# Patient Record
Sex: Female | Born: 1990 | Hispanic: Yes | Marital: Single | Smoking: Never smoker
Health system: Southern US, Community
[De-identification: ages and names within clinical notes are randomized; demographics above are authoritative.]

## PROBLEM LIST (undated history)

## (undated) DIAGNOSIS — G43909 Migraine, unspecified, not intractable, without status migrainosus: Secondary | ICD-10-CM

## (undated) DIAGNOSIS — D649 Anemia, unspecified: Secondary | ICD-10-CM

## (undated) DIAGNOSIS — I729 Aneurysm of unspecified site: Secondary | ICD-10-CM

## (undated) HISTORY — DX: Anemia, unspecified: D64.9

## (undated) HISTORY — DX: Migraine, unspecified, not intractable, without status migrainosus: G43.909

## (undated) HISTORY — DX: Aneurysm of unspecified site: I72.9

---

## 2021-01-15 ENCOUNTER — Other Ambulatory Visit: Payer: Self-pay

## 2021-01-15 ENCOUNTER — Emergency Department (HOSPITAL_COMMUNITY)
Admission: EM | Admit: 2021-01-15 | Discharge: 2021-01-15 | Disposition: A | Discharge status: Home / Self Care | Payer: Medicaid Other | Attending: Emergency Medicine | Admitting: Emergency Medicine

## 2021-01-15 ENCOUNTER — Emergency Department (HOSPITAL_COMMUNITY): Payer: Medicaid Other

## 2021-01-15 ENCOUNTER — Encounter (HOSPITAL_COMMUNITY): Payer: Self-pay | Admitting: Emergency Medicine

## 2021-01-15 DIAGNOSIS — R112 Nausea with vomiting, unspecified: Secondary | ICD-10-CM | POA: Insufficient documentation

## 2021-01-15 DIAGNOSIS — K625 Hemorrhage of anus and rectum: Secondary | ICD-10-CM | POA: Diagnosis not present

## 2021-01-15 DIAGNOSIS — K640 First degree hemorrhoids: Secondary | ICD-10-CM | POA: Diagnosis not present

## 2021-01-15 DIAGNOSIS — R109 Unspecified abdominal pain: Secondary | ICD-10-CM | POA: Diagnosis not present

## 2021-01-15 LAB — COMPREHENSIVE METABOLIC PANEL
ALT: 24 U/L (ref 0–44)
AST: 25 U/L (ref 15–41)
Albumin: 4.2 g/dL (ref 3.5–5.0)
Alkaline Phosphatase: 40 U/L (ref 38–126)
Anion gap: 5 (ref 5–15)
BUN: 7 mg/dL (ref 6–20)
CO2: 24 mmol/L (ref 22–32)
Calcium: 9.4 mg/dL (ref 8.9–10.3)
Chloride: 110 mmol/L (ref 98–111)
Creatinine, Ser: 0.74 mg/dL (ref 0.44–1.00)
GFR, Estimated: >60 mL/min (ref >60)
Glucose, Bld: 99 mg/dL (ref 70–99)
Potassium: 3.8 mmol/L (ref 3.5–5.1)
Sodium: 139 mmol/L (ref 135–145)
Total Bilirubin: 0.6 mg/dL (ref 0.3–1.2)
Total Protein: 7.6 g/dL (ref 6.5–8.1)

## 2021-01-15 LAB — CBC
HCT: 34.7 % — ABNORMAL LOW (ref 36.0–46.0)
Hemoglobin: 10.9 g/dL — ABNORMAL LOW (ref 12.0–15.0)
MCH: 27 pg (ref 26.0–34.0)
MCHC: 31.4 g/dL (ref 30.0–36.0)
MCV: 85.9 fL (ref 80.0–100.0)
Platelets: 213 10*3/uL (ref 150–400)
RBC: 4.04 MIL/uL (ref 3.87–5.11)
RDW: 15.1 % (ref 11.5–15.5)
WBC: 4.2 10*3/uL (ref 4.0–10.5)
nRBC: 0 % (ref 0.0–0.2)

## 2021-01-15 LAB — TYPE AND SCREEN
ABO/RH(D): O POS
Antibody Screen: NEGATIVE

## 2021-01-15 LAB — I-STAT BETA HCG BLOOD, ED (MC, WL, AP ONLY): I-stat hCG, quantitative: <5 m[IU]/mL (ref <5)

## 2021-01-15 NOTE — ED Provider Notes (Signed)
[facility]-EMERGENCY DEPT Provider Note   CSN: [id number] Arrival date & time: 01/15/21  6301     History Chief Complaint  Patient presents with   Rectal Bleeding    Brooke Elliott is a 30 y.o. female.  The history is provided by the patient and medical records.  Rectal Bleeding Brooke Elliott is a 30 y.o. female who presents to the Emergency Department complaining of rectal bleeding.  She started having BRBPR on June 5.  Occurs when she has a BM.  Was having one brown BM daily with some associated blood.  Last night developed nausea (1230).  Had associated abdominal cramping - mostly left sided, foul smelling BM followed by vomiting.  There was a large amount of blood in the commode.  Had four BMs this morning - brown with blood mixed in.  No hematemesis.    No fever, sob.  No dysuria.    Has no known medical problems.  Was told as a child she had something different with her heart.  No prior abdominal surgeries.  Has no known medical problems.    She goes to [facility] for primary care and has been diagnosed with anemia. She is not sure what her blood counts are. She has been prescribed iron but states this never helps her blood counts.    History reviewed. No pertinent past medical history.  There are no problems to display for this patient.   History reviewed. No pertinent surgical history.   OB History   No obstetric history on file.     No family history on file.     Home Medications Prior to Admission medications   Not on File    Allergies    Patient has no allergy information on record.  Review of Systems   Review of Systems  Gastrointestinal:  Positive for hematochezia.  All other systems reviewed and are negative.  Physical Exam Updated Vital Signs BP (!) 106/58 (BP Location: Right Arm)   Pulse 90   Temp 97.9 F (36.6 C) (Oral)   Resp 17   SpO2 99%   Physical Exam Vitals and nursing note reviewed.   Constitutional:      Appearance: She is well-developed.  HENT:     Head: Normocephalic and atraumatic.  Cardiovascular:     Rate and Rhythm: Normal rate and regular rhythm.     Heart sounds: No murmur heard. Pulmonary:     Effort: Pulmonary effort is normal. No respiratory distress.     Breath sounds: Normal breath sounds.  Abdominal:     Palpations: Abdomen is soft.     Tenderness: There is no abdominal tenderness. There is no guarding or rebound.  Genitourinary:    Comments: Small, nontender non-thrombosed external hemorrhoid. No gross blood on rectal exam. Musculoskeletal:        General: No tenderness.  Skin:    General: Skin is warm and dry.  Neurological:     Mental Status: She is alert and oriented to person, place, and time.  Psychiatric:        Behavior: Behavior normal.    ED Results / Procedures / Treatments   Labs (all labs ordered are listed, but only abnormal results are displayed) Labs Reviewed  CBC - Abnormal; Notable for the following components:      Result Value   Hemoglobin 10.9 (*)    HCT 34.7 (*)    All other components within normal limits  COMPREHENSIVE METABOLIC PANEL  I-STAT BETA  HCG BLOOD, ED ([facility], [facility], [facility] ONLY)  POC OCCULT BLOOD, ED  TYPE AND SCREEN  ABO/RH    EKG None  Radiology CT Abdomen Pelvis Wo Contrast  Result Date: 01/15/2021 CLINICAL DATA:  Rectal bleeding for 4 days, dark red blood in stool with bowel movements, intermittent lower abdominal pain and rectal pain, question diverticulitis EXAM: CT ABDOMEN AND PELVIS WITHOUT CONTRAST TECHNIQUE: Multidetector CT imaging of the abdomen and pelvis was performed following the standard protocol without IV contrast. Sagittal and coronal MPR images reconstructed from axial data set. No oral contrast administered. COMPARISON:  None FINDINGS: Lower chest: Lung bases clear Hepatobiliary: Contracted gallbladder.  Liver unremarkable. Pancreas: Normal appearance Spleen: Normal appearance.  Two  splenules, larger 2.0 cm diameter. Adrenals/Urinary Tract: Adrenal glands, kidneys, and ureters normal appearance. Bladder decompressed, poorly assessed Stomach/Bowel: Appendix not visualized. No pericecal inflammatory process. Stomach and bowel loops otherwise unremarkable. Vascular/Lymphatic: Aorta normal caliber. No adenopathy. Normal size mesenteric lymph nodes in mid abdomen and RIGHT lower quadrant. Reproductive: Unremarkable uterus and adnexa Other: No free air or free fluid. No hernia or inflammatory process. Musculoskeletal: Mild degenerative disc disease changes L5-S1. IMPRESSION: No acute intra-abdominal or intrapelvic abnormalities. Electronically Signed   By: Ulyses Southward M.D.   On: 01/15/2021 18:35    Procedures Procedures   Medications Ordered in ED Medications - No data to display  ED Course  I have reviewed the triage vital signs and the nursing notes.  Pertinent labs & imaging results that were available during my care of the patient were reviewed by me and considered in my medical decision making (see chart for details).    MDM Rules/Calculators/A&P                         patient here for evaluation of hematochezia. Did have normal bowel movements with blood in the toilet for several days then nausea, vomiting, diarrhea today. She did have some abdominal tenderness today as well. On examination abdomen is benign. CBC with anemia. She states she has a history of anemia, baseline hemoglobin is unknown. She has no gross blood on rectal examination. She does have one small external hemorrhoid. CT scan is negative for acute abnormality. Feel patient is very low risk for major G.I. bleed and feels she is stable for outpatient G.I. follow-up. Discussed with patient home care for hematochezia, possibly secondary to hemorrhoid versus viral process. Discussed outpatient follow-up and return precautions.  Final Clinical Impression(s) / ED Diagnoses Final diagnoses:  Rectal bleeding   Grade I hemorrhoids    Rx / DC Orders ED Discharge Orders     None        Tilden Fossa, MD 01/15/21 2212

## 2021-01-15 NOTE — ED Provider Notes (Signed)
Emergency Medicine Provider Triage Evaluation Note  Brooke Elliott , a 30 y.o. female  was evaluated in triage.  Pt complains of rectal bleeding.  Has been ongoing for the past 4 days she has been seeing dark red blood in her stool with bowel movement.  Also reports she has had some intermittent lower abdominal pain and associated rectal pain.  Did have 1 episode of vomiting.  No fevers.  Took a dose of aspirin a few days ago but otherwise no blood thinners.  Review of Systems  Positive: Rectal bleeding, abdominal pain Negative: Syncope  Physical Exam  BP 133/72 (BP Location: Right Arm)   Pulse 63   Temp 97.6 F (36.4 C) (Oral)   Resp 18   SpO2 100%  Gen:   Awake, no distress   Resp:  Normal effort  MSK:   Moves extremities without difficulty  Other:  Abdomen soft, nontender  Medical Decision Making  Medically screening exam initiated at 10:47 AM.  Appropriate orders placed.  Brooke Elliott was informed that the remainder of the evaluation will be completed by another provider, this initial triage assessment does not replace that evaluation, and the importance of remaining in the ED until their evaluation is complete.     Dartha Lodge, PA-C 01/15/21 1111    [name], MD 01/21/21 [phone]

## 2021-01-15 NOTE — ED Triage Notes (Signed)
Per pt, states she has been having bleeding from her rectum since 6/5-states dark in color-occurs with BM-some abdominal cramping for 2 weeks

## 2021-02-12 ENCOUNTER — Emergency Department (HOSPITAL_COMMUNITY)
Admission: EM | Admit: 2021-02-12 | Discharge: 2021-02-13 | Disposition: A | Discharge status: Home / Self Care | Payer: Medicaid Other | Attending: Emergency Medicine | Admitting: Emergency Medicine

## 2021-02-12 ENCOUNTER — Emergency Department (HOSPITAL_COMMUNITY): Payer: Medicaid Other

## 2021-02-12 ENCOUNTER — Encounter (HOSPITAL_COMMUNITY): Payer: Self-pay

## 2021-02-12 ENCOUNTER — Other Ambulatory Visit: Payer: Self-pay

## 2021-02-12 DIAGNOSIS — G4459 Other complicated headache syndrome: Secondary | ICD-10-CM | POA: Diagnosis not present

## 2021-02-12 DIAGNOSIS — G43109 Migraine with aura, not intractable, without status migrainosus: Secondary | ICD-10-CM

## 2021-02-12 DIAGNOSIS — Z20822 Contact with and (suspected) exposure to covid-19: Secondary | ICD-10-CM | POA: Insufficient documentation

## 2021-02-12 DIAGNOSIS — R519 Headache, unspecified: Secondary | ICD-10-CM | POA: Diagnosis present

## 2021-02-12 LAB — CBC WITH DIFFERENTIAL/PLATELET
Abs Immature Granulocytes: 0.01 10*3/uL (ref 0.00–0.07)
Basophils Absolute: 0 10*3/uL (ref 0.0–0.1)
Basophils Relative: 0 %
Eosinophils Absolute: 0 10*3/uL (ref 0.0–0.5)
Eosinophils Relative: 0 %
HCT: 35.1 % — ABNORMAL LOW (ref 36.0–46.0)
Hemoglobin: 11.1 g/dL — ABNORMAL LOW (ref 12.0–15.0)
Immature Granulocytes: 0 %
Lymphocytes Relative: 28 %
Lymphs Abs: 1.6 10*3/uL (ref 0.7–4.0)
MCH: 27.6 pg (ref 26.0–34.0)
MCHC: 31.6 g/dL (ref 30.0–36.0)
MCV: 87.3 fL (ref 80.0–100.0)
Monocytes Absolute: 0.3 10*3/uL (ref 0.1–1.0)
Monocytes Relative: 4 %
Neutro Abs: 3.8 10*3/uL (ref 1.7–7.7)
Neutrophils Relative %: 68 %
Platelets: 207 10*3/uL (ref 150–400)
RBC: 4.02 MIL/uL (ref 3.87–5.11)
RDW: 15.6 % — ABNORMAL HIGH (ref 11.5–15.5)
WBC: 5.7 10*3/uL (ref 4.0–10.5)
nRBC: 0 % (ref 0.0–0.2)

## 2021-02-12 MED ORDER — DIPHENHYDRAMINE HCL 50 MG/ML IJ SOLN
25.0000 mg | Freq: Once | INTRAMUSCULAR | Status: DC
  Filled 2021-02-12: qty 1

## 2021-02-12 MED ORDER — METOCLOPRAMIDE HCL 5 MG/ML IJ SOLN
10.0000 mg | Freq: Once | INTRAMUSCULAR | Status: DC
  Filled 2021-02-12: qty 2

## 2021-02-12 MED ORDER — SODIUM CHLORIDE 0.9 % IV BOLUS
1000.0000 mL | Freq: Once | INTRAVENOUS | Status: AC
  Administered 2021-02-12: 1000 mL via INTRAVENOUS

## 2021-02-12 NOTE — ED Triage Notes (Signed)
Pt arrives EMS with c/o recurrent headache after lack of sleep, intermittent fasting and multiple stressors in the home. Pt became concerned after experiencing a brief period of "unable to talk and numbness of the right hand" that quickly resolved. Sx began at 1830 and resolved by 1900.

## 2021-02-12 NOTE — ED Provider Notes (Signed)
[facility]-EMERGENCY DEPT Provider Note   CSN: [id number] Arrival date & time: 02/12/21  1946     History Chief Complaint  Patient presents with   Headache    Brooke Elliott is a 30 y.o. female.  Patient here with episode of difficulty speaking and right arm tingling.  States she was having a conversation with her family when she developed tingling in her right hand first involving her fifth digit then fourth digit then third digit then second digit and entire right hand with difficulty moving the fingers and numbness.  She states at the same time she has difficulty getting her words out and was unable to talk.  Symptoms lasted about 30 minutes and have since resolved.  She feels back to baseline now without having a headache.  Contrary to triage note she was not having headache before this happened.  Headache gradually onset about 6 PM after her speech returned.  Denies thunderclap onset.  Does have a history of migraines in the past but this feels different and she is not had a migraine for several years.  No photophobia or phonophobia.  No fever or vomiting.  No recent illness with cough, cold or congestion symptoms.  She reported that she thought her right hand felt weak and was numb and tingly for about the same time as her speech.  She had no facial droop.  She had no difficulty moving her leg.  She has chronic back pain which is unchanged.  Denies any argument with her family and states this is normal conversation.  The history is provided by the patient.  Headache Associated symptoms: numbness and weakness   Associated symptoms: no abdominal pain, no congestion, no cough, no dizziness, no fatigue, no fever, no myalgias, no nausea, no seizures and no vomiting       History reviewed. No pertinent past medical history.  There are no problems to display for this patient.   History reviewed. No pertinent surgical history.   OB History   No obstetric history on  file.     No family history on file.  Social History   Tobacco Use   Smoking status: Never   Smokeless tobacco: Never  Substance Use Topics   Alcohol use: Not Currently   Drug use: Never    Home Medications Prior to Admission medications   Not on File    Allergies    Patient has no known allergies.  Review of Systems   Review of Systems  Constitutional:  Negative for activity change, appetite change, fatigue and fever.  HENT:  Negative for congestion and rhinorrhea.   Eyes:  Negative for visual disturbance.  Respiratory:  Negative for cough, chest tightness and shortness of breath.   Cardiovascular:  Negative for chest pain.  Gastrointestinal:  Negative for abdominal pain, nausea and vomiting.  Genitourinary:  Negative for dysuria and hematuria.  Musculoskeletal:  Negative for arthralgias and myalgias.  Skin:  Negative for rash.  Neurological:  Positive for weakness, numbness and headaches. Negative for dizziness and seizures.   all other systems are negative except as noted in the HPI and PMH.   Physical Exam Updated Vital Signs BP (!) 112/55 (BP Location: Left Arm)   Pulse 69   Temp 99.9 F (37.7 C) (Oral)   Resp 16   Ht 5\' 5"  (1.651 m)   Wt 66.2 kg   SpO2 100%   BMI 24.30 kg/m   Physical Exam Vitals and nursing note reviewed.  Constitutional:  General: She is not in acute distress.    Appearance: She is well-developed. She is not ill-appearing.  HENT:     Head: Normocephalic and atraumatic.     Mouth/Throat:     Pharynx: No oropharyngeal exudate.  Eyes:     Conjunctiva/sclera: Conjunctivae normal.     Pupils: Pupils are equal, round, and reactive to light.  Neck:     Comments: No meningismus. Cardiovascular:     Rate and Rhythm: Normal rate and regular rhythm.     Heart sounds: Normal heart sounds. No murmur heard. Pulmonary:     Effort: Pulmonary effort is normal. No respiratory distress.     Breath sounds: Normal breath sounds.  Chest:      Chest wall: No tenderness.  Abdominal:     Palpations: Abdomen is soft.     Tenderness: There is no abdominal tenderness. There is no guarding or rebound.  Musculoskeletal:        General: No tenderness. Normal range of motion.     Cervical back: Normal range of motion and neck supple.  Skin:    General: Skin is warm.  Neurological:     Mental Status: She is alert and oriented to person, place, and time.     Cranial Nerves: No cranial nerve deficit.     Motor: No abnormal muscle tone.     Coordination: Coordination normal.     Comments: CN 2-12 intact, no ataxia on finger to nose, no nystagmus, 5/5 strength throughout, no pronator drift, Romberg negative, normal gait.   Psychiatric:        Behavior: Behavior normal.    ED Results / Procedures / Treatments   Labs (all labs ordered are listed, but only abnormal results are displayed) Labs Reviewed  CBC WITH DIFFERENTIAL/PLATELET - Abnormal; Notable for the following components:      Result Value   Hemoglobin 11.1 (*)    HCT 35.1 (*)    RDW 15.6 (*)    All other components within normal limits  COMPREHENSIVE METABOLIC PANEL - Abnormal; Notable for the following components:   Glucose, Bld 100 (*)    All other components within normal limits  RESP PANEL BY RT-PCR (FLU A&B, COVID) ARPGX2  I-STAT BETA HCG BLOOD, ED ([facility], [facility], [facility] ONLY)    EKG None  Radiology No results found.  Procedures Procedures   Medications Ordered in ED Medications  metoCLOPramide (REGLAN) injection 10 mg (has no administration in time range)  diphenhydrAMINE (BENADRYL) injection 25 mg (has no administration in time range)  sodium chloride 0.9 % bolus 1,000 mL (has no administration in time range)    ED Course  I have reviewed the triage vital signs and the nursing notes.  Pertinent labs & imaging results that were available during my care of the patient were reviewed by me and considered in my medical decision making (see chart for  details).    MDM Rules/Calculators/A&P                         Episode of right arm tingling and weakness with difficulty speaking, now resolved after about 30 minutes.  Associated with gradual onset headache that occurred after her neurological issues had resolved.  Does have a history of migraines.  Neurological exam is normal.  Suspect complicated migraine.  Will obtain CT head as patient has no recent neuroimaging.  CT head is normal.  Discussed with neurology Dr. Derry Lory.  He agrees likely complicated migraine  but recommends MRI and MRV of brain. Not available at this facility.  Patient refuses IV medications and wishes to have p.o. Tylenol.  Patient prefers to wait for MRI availability at [facility] rather than be transferred to [facility]. [facility] under lockdown with multiple critical trauma patients.  MRI and MRV are negative.  Patient continues to feel well. No further headache.   Refer to neurology for followup. Return precautions discussed.  Final diagnoses:  Complicated migraine    Rx / DC Orders ED Discharge Orders     None        Deyja Sochacki, Jeannett Senior, MD 02/13/21 [phone]

## 2021-02-13 ENCOUNTER — Emergency Department (HOSPITAL_COMMUNITY): Payer: Medicaid Other

## 2021-02-13 LAB — COMPREHENSIVE METABOLIC PANEL
ALT: 15 U/L (ref 0–44)
AST: 17 U/L (ref 15–41)
Albumin: 4.4 g/dL (ref 3.5–5.0)
Alkaline Phosphatase: 41 U/L (ref 38–126)
Anion gap: 9 (ref 5–15)
BUN: 13 mg/dL (ref 6–20)
CO2: 22 mmol/L (ref 22–32)
Calcium: 9.3 mg/dL (ref 8.9–10.3)
Chloride: 109 mmol/L (ref 98–111)
Creatinine, Ser: 0.67 mg/dL (ref 0.44–1.00)
GFR, Estimated: >60 mL/min (ref >60)
Glucose, Bld: 100 mg/dL — ABNORMAL HIGH (ref 70–99)
Potassium: 3.8 mmol/L (ref 3.5–5.1)
Sodium: 140 mmol/L (ref 135–145)
Total Bilirubin: 0.3 mg/dL (ref 0.3–1.2)
Total Protein: 8 g/dL (ref 6.5–8.1)

## 2021-02-13 LAB — RESP PANEL BY RT-PCR (FLU A&B, COVID) ARPGX2
Influenza A by PCR: NEGATIVE
Influenza B by PCR: NEGATIVE
SARS Coronavirus 2 by RT PCR: NEGATIVE

## 2021-02-13 LAB — I-STAT BETA HCG BLOOD, ED (MC, WL, AP ONLY): I-stat hCG, quantitative: <5 m[IU]/mL (ref <5)

## 2021-02-13 MED ORDER — GADOBUTROL 1 MMOL/ML IV SOLN
6.0000 mL | Freq: Once | INTRAVENOUS | Status: AC | PRN
  Administered 2021-02-13: 6 mL via INTRAVENOUS

## 2021-02-13 MED ORDER — ACETAMINOPHEN 500 MG PO TABS
1000.0000 mg | ORAL_TABLET | Freq: Once | ORAL | Status: AC
  Administered 2021-02-13: 1000 mg via ORAL
  Filled 2021-02-13: qty 2

## 2021-02-13 NOTE — ED Notes (Signed)
Vital signs stable. 

## 2021-02-13 NOTE — ED Notes (Signed)
Pt in MRI, unable to obtain vitals.

## 2021-02-13 NOTE — ED Notes (Signed)
Pt ambulated to bathroom with no assistance.  

## 2021-02-13 NOTE — ED Notes (Signed)
Patient is resting comfortably. 

## 2021-02-13 NOTE — Discharge Instructions (Addendum)
Your testing is reassuring.  No evidence of stroke.  Follow-up with your primary doctor as well as neurologist.  Return to the ED if you develop new or worsening symptoms

## 2021-02-13 NOTE — ED Notes (Addendum)
Patient was given a ham sandwich and cheese with crackers. Patient has water at bedside.

## 2021-02-13 NOTE — ED Notes (Addendum)
Patient transported to MRI 

## 2021-12-01 ENCOUNTER — Emergency Department (HOSPITAL_COMMUNITY)
Admission: EM | Admit: 2021-12-01 | Discharge: 2021-12-01 | Disposition: A | Discharge status: Home / Self Care | Payer: Medicaid Other | Attending: Emergency Medicine | Admitting: Emergency Medicine

## 2021-12-01 DIAGNOSIS — R531 Weakness: Secondary | ICD-10-CM | POA: Diagnosis not present

## 2021-12-01 DIAGNOSIS — R42 Dizziness and giddiness: Secondary | ICD-10-CM | POA: Diagnosis present

## 2021-12-01 DIAGNOSIS — N9489 Other specified conditions associated with female genital organs and menstrual cycle: Secondary | ICD-10-CM | POA: Insufficient documentation

## 2021-12-01 LAB — COMPREHENSIVE METABOLIC PANEL
ALT: 39 U/L (ref 0–44)
AST: 39 U/L (ref 15–41)
Albumin: 4.1 g/dL (ref 3.5–5.0)
Alkaline Phosphatase: 51 U/L (ref 38–126)
Anion gap: 8 (ref 5–15)
BUN: 6 mg/dL (ref 6–20)
CO2: 22 mmol/L (ref 22–32)
Calcium: 9.6 mg/dL (ref 8.9–10.3)
Chloride: 109 mmol/L (ref 98–111)
Creatinine, Ser: 0.71 mg/dL (ref 0.44–1.00)
GFR, Estimated: >60 mL/min (ref >60)
Glucose, Bld: 96 mg/dL (ref 70–99)
Potassium: 3.9 mmol/L (ref 3.5–5.1)
Sodium: 139 mmol/L (ref 135–145)
Total Bilirubin: 0.4 mg/dL (ref 0.3–1.2)
Total Protein: 7.3 g/dL (ref 6.5–8.1)

## 2021-12-01 LAB — CBC WITH DIFFERENTIAL/PLATELET
Abs Immature Granulocytes: 0.01 10*3/uL (ref 0.00–0.07)
Basophils Absolute: 0 10*3/uL (ref 0.0–0.1)
Basophils Relative: 0 %
Eosinophils Absolute: 0 10*3/uL (ref 0.0–0.5)
Eosinophils Relative: 0 %
HCT: 34.8 % — ABNORMAL LOW (ref 36.0–46.0)
Hemoglobin: 10.6 g/dL — ABNORMAL LOW (ref 12.0–15.0)
Immature Granulocytes: 0 %
Lymphocytes Relative: 21 %
Lymphs Abs: 1 10*3/uL (ref 0.7–4.0)
MCH: 24.5 pg — ABNORMAL LOW (ref 26.0–34.0)
MCHC: 30.5 g/dL (ref 30.0–36.0)
MCV: 80.4 fL (ref 80.0–100.0)
Monocytes Absolute: 0.3 10*3/uL (ref 0.1–1.0)
Monocytes Relative: 6 %
Neutro Abs: 3.7 10*3/uL (ref 1.7–7.7)
Neutrophils Relative %: 73 %
Platelets: 244 10*3/uL (ref 150–400)
RBC: 4.33 MIL/uL (ref 3.87–5.11)
RDW: 15.9 % — ABNORMAL HIGH (ref 11.5–15.5)
WBC: 5.1 10*3/uL (ref 4.0–10.5)
nRBC: 0 % (ref 0.0–0.2)

## 2021-12-01 LAB — I-STAT BETA HCG BLOOD, ED (MC, WL, AP ONLY): I-stat hCG, quantitative: <5 m[IU]/mL (ref <5)

## 2021-12-01 NOTE — Discharge Instructions (Addendum)
Don't get concerned about the title being for "substance abuse." These places accept counseling for all types of concerns. Continue to drink plenty of fluids - more water than coffee. Follow up with primary care doctor for lab results as well as heart monitor. Complete Echocardiogram scheduled for this Friday. Look for worrisome signs and symptoms we discussed, and do not hesitate to return to emergency department if noticed.  ?

## 2021-12-01 NOTE — ED Triage Notes (Signed)
EMS stated, she has been weak and dizzy. She called [place] in the grocery store because she felt weak. Went to her Dr. Maurice Small week and treated her for anxiety. ?

## 2021-12-01 NOTE — ED Provider Triage Note (Signed)
Emergency Medicine Provider Triage Evaluation Note ? ?Brooke Elliott , a 31 y.o. female  was evaluated in triage.  Pt complains of weakness, dizziness (about to pass out), brought in by EMS, feet started to feel tingling on the bottoms. Symptoms x 2 weeks, BP was low 98/68 recently, unsure if related.  ?Went to PCP last week and had unknown labs sent out, was told it would take 2 weeks to get results. Known hgb baseline 10, started on B12 and Folic acid to try and help, took for 3 days with ? Improvement.  ?Ambulatory from grocery store to the truck.  ?Review of Systems  ?Positive: Weakness, pre syncopal  ?Negative: CP, SHOB, heavy periods, blood in stools ? ?Physical Exam  ?There were no vitals taken for this visit. ?Gen:   Awake, no distress   ?Resp:  Normal effort  ?MSK:   Moves extremities without difficulty  ?Other:  Speech clear, no distress  ?Medical Decision Making  ?Medically screening exam initiated at 11:13 AM.  Appropriate orders placed.  Dustee Bottenfield was informed that the remainder of the evaluation will be completed by another provider, this initial triage assessment does not replace that evaluation, and the importance of remaining in the ED until their evaluation is complete. ? ? ?  ?Jeannie Fend, PA-C ?12/01/21 1121 ? ?

## 2021-12-01 NOTE — ED Provider Notes (Addendum)
?[facility] ?Provider Note ? ? ?CSN: [id number] ?Arrival date & time: 12/01/21  1101 ? ?  ? ?History ? ?Chief Complaint  ?Patient presents with  ? Dizziness  ? Weakness  ? ? ?Brooke Elliott is a [age] y.o. female. ? ? ?Dizziness ?Associated symptoms: no blood in stool, no chest pain, no diarrhea, no headaches, no nausea, no palpitations, no vomiting and no weakness   ?Weakness ?Associated symptoms: dizziness   ?Associated symptoms: no abdominal pain, no arthralgias, no chest pain, no diarrhea, no dysuria, no fever, no frequency, no headaches, no nausea, no seizures, no urgency and no vomiting   ? ?31 year old female presents emergency department with episode of dizziness/lightheadedness after eating a sandwich earlier this morning.  Episodes are often preceded by postural change such as standing up from a bent over position. She has not lost consciousness. She states she has had multiple similar episodes over the past couple weeks and they last a few minutes. No associated weakness or persistent symptoms past the episodes. She went to her family doctor last week where they did a thorough lab work up, but she has not heard the results back. She has a past medical history significant for anxiety and patient reported Mitral valve regurgitation diagnosed when she was younger. Patient states she has had a workup for these same symptoms 2-3 years ago that was inconclusive. She has been taking B12 and folate vitamins the past few days without any symptoms, but didn't take them this morning and had symptoms. Upon further questioning, she expressing being under extreme amounts of stress between caring for her young son, taking multiple classes, working full time, processing through a divorce... She is accompanied by her mother who is concerned as well. Patient denies cp, sob, abdominal pain, n/v/d,  urinary/bowel symptoms, weakness, fever, chills, cough.  ? ?Home Medications ?Prior to  Admission medications   ?Not on File  ?   ? ?Allergies    ?Patient has no known allergies.   ? ?Review of Systems   ?Review of Systems  ?Constitutional:  Negative for chills, fatigue and fever.  ?HENT:  Negative for congestion, postnasal drip, rhinorrhea, sneezing and sore throat.   ?Cardiovascular:  Negative for chest pain, palpitations and leg swelling.  ?Gastrointestinal:  Negative for abdominal distention, abdominal pain, blood in stool, diarrhea, nausea and vomiting.  ?Genitourinary:  Negative for decreased urine volume, dysuria, flank pain, frequency, hematuria, pelvic pain, urgency, vaginal bleeding and vaginal discharge.  ?Musculoskeletal:  Negative for arthralgias, back pain, joint swelling and neck stiffness.  ?Skin:  Negative for color change, pallor, rash and wound.  ?Neurological:  Positive for dizziness and light-headedness. Negative for tremors, seizures, syncope, speech difficulty, weakness, numbness and headaches.  ? ?Physical Exam ?Updated Vital Signs ?BP 114/79   Pulse 68   Temp 98.1 ?F (36.7 ?C) (Oral)   Resp 15   SpO2 100%  ?Physical Exam ?Vitals and nursing note reviewed.  ?Constitutional:   ?   General: She is not in acute distress. ?   Appearance: Normal appearance. She is normal weight. She is not ill-appearing, toxic-appearing or diaphoretic.  ?HENT:  ?   Head: Normocephalic.  ?   Nose: Nose normal.  ?   Mouth/Throat:  ?   Mouth: Mucous membranes are moist.  ?   Pharynx: Oropharynx is clear.  ?Eyes:  ?   Pupils: Pupils are equal, round, and reactive to light.  ?Cardiovascular:  ?   Rate and Rhythm: Normal rate and  regular rhythm.  ?   Pulses: Normal pulses.  ?   Heart sounds: Normal heart sounds. No murmur heard. ?  No friction rub. No gallop.  ?   Comments: No murmur auscultated on exam ?Pulmonary:  ?   Effort: Pulmonary effort is normal. No respiratory distress.  ?   Breath sounds: Normal breath sounds. No stridor. No wheezing, rhonchi or rales.  ?Chest:  ?   Chest wall: No  tenderness.  ?Abdominal:  ?   General: Abdomen is flat. There is no distension.  ?   Palpations: Abdomen is soft. There is no mass.  ?   Tenderness: There is no abdominal tenderness.  ?Musculoskeletal:     ?   General: Normal range of motion.  ?   Cervical back: Normal range of motion and neck supple. No tenderness.  ?   Comments: Muscle strength 5/5 in upper and lower extremities, radial and posterior tibial pulses equal and full bilaterally. Patient is not experiencing any sensory deficits along major nerve distributions in upper and lower extremities. No overlying skin abnormalities noted on upper in lower extremities including erythema, edema, induration, lesions, fluctuance.   ?Skin: ?   General: Skin is warm and dry.  ?   Capillary Refill: Capillary refill takes less than 2 seconds.  ?Neurological:  ?   General: No focal deficit present.  ?   Mental Status: She is alert and oriented to person, place, and time.  ?   Cranial Nerves: No cranial nerve deficit.  ?   Sensory: No sensory deficit.  ?   Motor: No weakness.  ?   Coordination: Coordination normal.  ?   Gait: Gait normal.  ?   Deep Tendon Reflexes: Reflexes normal.  ?Psychiatric:     ?   Mood and Affect: Mood normal.     ?   Behavior: Behavior normal. Behavior is cooperative.  ? ? ?ED Results / Procedures / Treatments   ?Labs ?(all labs ordered are listed, but only abnormal results are displayed) ?Labs Reviewed  ?CBC WITH DIFFERENTIAL/PLATELET - Abnormal; Notable for the following components:  ?    Result Value  ? Hemoglobin 10.6 (*)   ? HCT 34.8 (*)   ? MCH 24.5 (*)   ? RDW 15.9 (*)   ? All other components within normal limits  ?COMPREHENSIVE METABOLIC PANEL  ?I-STAT BETA HCG BLOOD, ED ([facility], [facility], [facility] ONLY)  ? ? ?EKG ?None ? ?Radiology ?No results found. ? ?Procedures ?Procedures  ? ? ?Medications Ordered in ED ?Medications - No data to display ? ?ED Course/ Medical Decision Making/ A&P ?  ?                        ?Medical Decision Making ? ?This patient  presents to the ED for concern of dizziness/lightheadedness, this involves an extensive number of treatment options, and is a complaint that carries with it a high risk of complications and morbidity.  The differential diagnosis includes syncope, near-syncope, vertigo, anemia, arrhythmia, dehydration, orthostatic hypotension, valvular disorder, carotid artery occlusion.  ? ? ?Co morbidities that complicate the patient evaluation ? ?Anxiety ? ? ?Additional history obtained: ? ?Additional history obtained from mother and office notes ?External records from outside source obtained and reviewed including MR of head from 02/13/21 ? ? ?Lab Tests: ? ?I Ordered, and personally interpreted labs.  The pertinent results include:  no acute abnormalities ? ? ?Imaging Studies ordered: ? ?No labs ordered.  ? ? ?  Cardiac Monitoring: / EKG: ? ?The patient was maintained on a cardiac monitor.  I personally viewed and interpreted the cardiac monitored which showed an underlying rhythm of: nsr ? ? ?Consultations Obtained: ? ?No consultations obtained ? ? ?Problem List / ED Course / Critical interventions / Medication management ? ?Dizziness ?No medications were ordered.  ?Reevaluation of the patient after these medicines showed that the patient stayed the same ?I have reviewed the patients home medicines and have made adjustments as needed ? ? ?Social Determinants of Health: ? ?Multiple life stressors.  ? ? ?Test / Admission - Considered: ? ?Dizziness ?Vital signs within normal range and stable throughout visit.  ?Labs insignificant for any acute pathology.  ?Further imaging and labs considered but deemed unnecessary due to the low emergent nature of the patient presentation coupled with appropriate labs and imaging ordered by family doctor.  ?I recommended Echocardiogram in the ED today, but the patient declined because she needed to pick up her child and because she has one scheduled for later this week. ?I talked to the patient and her  mother at length about counseling options for patient's stated anxiety and multiple life stressors. We formulated a plan going forward and supplied the family with multiple counseling resources. ?We walked through options

## 2021-12-05 ENCOUNTER — Other Ambulatory Visit: Payer: Self-pay

## 2021-12-05 ENCOUNTER — Emergency Department (HOSPITAL_COMMUNITY)
Admission: EM | Admit: 2021-12-05 | Discharge: 2021-12-06 | Disposition: A | Discharge status: Home / Self Care | Payer: Medicaid Other | Attending: Emergency Medicine | Admitting: Emergency Medicine

## 2021-12-05 ENCOUNTER — Encounter (HOSPITAL_COMMUNITY): Payer: Self-pay | Admitting: Emergency Medicine

## 2021-12-05 DIAGNOSIS — R42 Dizziness and giddiness: Secondary | ICD-10-CM | POA: Diagnosis not present

## 2021-12-05 DIAGNOSIS — N9489 Other specified conditions associated with female genital organs and menstrual cycle: Secondary | ICD-10-CM | POA: Insufficient documentation

## 2021-12-05 LAB — URINALYSIS, ROUTINE W REFLEX MICROSCOPIC
Bilirubin Urine: NEGATIVE
Glucose, UA: NEGATIVE mg/dL
Hgb urine dipstick: NEGATIVE
Ketones, ur: 5 mg/dL — AB
Leukocytes,Ua: NEGATIVE
Nitrite: NEGATIVE
Protein, ur: NEGATIVE mg/dL
Specific Gravity, Urine: 1.015 (ref 1.005–1.030)
pH: 5 (ref 5.0–8.0)

## 2021-12-05 LAB — BASIC METABOLIC PANEL
Anion gap: 8 (ref 5–15)
BUN: 7 mg/dL (ref 6–20)
CO2: 20 mmol/L — ABNORMAL LOW (ref 22–32)
Calcium: 9.2 mg/dL (ref 8.9–10.3)
Chloride: 108 mmol/L (ref 98–111)
Creatinine, Ser: 0.63 mg/dL (ref 0.44–1.00)
GFR, Estimated: >60 mL/min (ref >60)
Glucose, Bld: 95 mg/dL (ref 70–99)
Potassium: 3.8 mmol/L (ref 3.5–5.1)
Sodium: 136 mmol/L (ref 135–145)

## 2021-12-05 LAB — CBC
HCT: 35 % — ABNORMAL LOW (ref 36.0–46.0)
Hemoglobin: 10.5 g/dL — ABNORMAL LOW (ref 12.0–15.0)
MCH: 24.5 pg — ABNORMAL LOW (ref 26.0–34.0)
MCHC: 30 g/dL (ref 30.0–36.0)
MCV: 81.6 fL (ref 80.0–100.0)
Platelets: 234 10*3/uL (ref 150–400)
RBC: 4.29 MIL/uL (ref 3.87–5.11)
RDW: 16.7 % — ABNORMAL HIGH (ref 11.5–15.5)
WBC: 5.8 10*3/uL (ref 4.0–10.5)
nRBC: 0 % (ref 0.0–0.2)

## 2021-12-05 LAB — CBG MONITORING, ED: Glucose-Capillary: 75 mg/dL (ref 70–99)

## 2021-12-05 LAB — I-STAT BETA HCG BLOOD, ED (MC, WL, AP ONLY): I-stat hCG, quantitative: <5 m[IU]/mL (ref <5)

## 2021-12-05 NOTE — ED Triage Notes (Signed)
Pt reported to ED with c/o of feeling dizzy and "like she is going to pass out". Pt states he has low iron and was told by PCP to come to ED if she continues to feel faint. Pt has excessive and rapid speech at time of triage, constantly talks with family member during time of triage, very seldomly taking time to pause and listen to questions asked by this RN.  ?

## 2021-12-06 ENCOUNTER — Ambulatory Visit (HOSPITAL_COMMUNITY): Admission: RE | Admit: 2021-12-06 | Payer: Medicaid Other | Source: Ambulatory Visit

## 2021-12-06 NOTE — Discharge Instructions (Signed)
You dizzy spells could be coming from your anemia (iron deficiency).  Continue taking the iron supplements.  I think that blood clot is unlikely, but I've ordered the ultrasound study.  Follow the directions below. ? ?Please follow-up with your doctor.  ?

## 2021-12-06 NOTE — ED Provider Notes (Signed)
?[facility]PT ?[facility] Emergency Department ?Provider Note ?MRN:  [id number]  ?Arrival date & time: 12/06/21    ? ?Chief Complaint   ?Dizziness ?  ?History of Present Illness   ?Brooke Elliott is a 31 y.o. year-old female presents to the ED with chief complaint of dizziness.  Reports having several episodes of dizziness over the past couple of weeks.  Seen by PCP and diagnosed with iron deficient anemia.  Patient also reports feeling anxious and that she sometimes has tingling in her legs, arms, and face when this happens.  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 ? ?History provided by patient. ? ? ?Review of Systems  ?Pertinent review of systems noted in HPI.  ? ? ?Physical Exam  ? ?Vitals:  ? 12/06/21 0252 12/06/21 0253  ?BP:  112/69  ?Pulse: 67 67  ?Resp: 14 18  ?Temp:    ?SpO2: 98% 99%  ?  ?CONSTITUTIONAL:  anxious-appearing, NAD ?NEURO:  Alert and oriented x 3, CN 3-12 grossly intact ?EYES:  eyes equal and reactive ?ENT/NECK:  Supple, no stridor  ?CARDIO:  normal rate, regular rhythm, appears well-perfused  ?PULM:  No respiratory distress,  ?GI/GU:  non-distended,  ?MSK/SPINE:  No gross deformities, no edema, moves all extremities  ?SKIN:  no rash, atraumatic ? ? ?*Additional and/or pertinent findings included in MDM below ? ?Diagnostic and Interventional Summary  ? ? EKG Interpretation ? ?Date/Time:    ?Ventricular Rate:    ?PR Interval:    ?QRS Duration:   ?QT Interval:    ?QTC Calculation:   ?R Axis:     ?Text Interpretation:   ?  ? ?  ? ?Labs Reviewed  ?BASIC METABOLIC PANEL - Abnormal; Notable for the following components:  ?    Result Value  ? CO2 20 (*)   ? All other components within normal limits  ?CBC - Abnormal; Notable for the following components:  ? Hemoglobin 10.5 (*)   ? HCT 35.0 (*)   ? MCH 24.5 (*)   ? RDW 16.7 (*)   ? All other components within normal limits  ?URINALYSIS, ROUTINE W REFLEX MICROSCOPIC - Abnormal; Notable for the following components:  ?  Color, Urine STRAW (*)   ? Ketones, ur 5 (*)   ? All other components within normal limits  ?CBG MONITORING, ED  ?I-STAT BETA HCG BLOOD, ED ([facility], [facility], [facility] ONLY)  ?  ?LE VENOUS    (Results Pending)  ?  ?Medications - No data to display  ? ?Procedures  /  Critical Care ?Procedures ? ?ED Course and Medical Decision Making  ?I have reviewed the triage vital signs, the nursing notes, and pertinent available records from the EMR. ? ?Complexity of Problems Addressed: ?Moderate Complexity: Acute illness with systemic symptoms, requiring diagnostic workup to rule out more severe disease. ?Comorbidities affecting this illness/injury include: ?Iron deficient anemia ?Social Determinants Affecting Care: ?No clinically significant social determinants affecting this chief complaint.. ? ? ?ED Course: ?After considering the following differential, anemia, anxiety, PE, dysrhythmia,  I agree with labs ordered in triage. ?I personally interpreted the labs which are notable for stable HGB. ? ? ? ?  ? ?Consultants: ?No consultations were needed in caring for this patient. ? ?Treatment and Plan: ?Outpatient follow-up.  Continue iron supplements.  While DVT/PE is thought unlikely, patient requests [place] to r/o DVT.  I ordered this for her in the morning since they are not available at this time. ? ?We also  discussed that her symptoms could be stress induced and panic attack in nature.  I've encouraged her to follow-up with her doctor and talk to her about her stress levels. ? ?Emergency department workup does not suggest an emergent condition requiring admission or immediate intervention beyond  what has been performed at this time. The patient is safe for discharge and has  been instructed to return immediately for worsening symptoms, change in  symptoms or any other concerns ? ? ? ?Final Clinical Impressions(s) / ED Diagnoses  ? ?  ICD-10-CM   ?1. Dizziness  R42   ?  ?  ?ED Discharge Orders   ? ?      Ordered  ?  LE VENOUS       ? 12/06/21  0319  ? ?  ?  ? ?  ?  ? ? ?Discharge Instructions Discussed with and Provided to Patient:  ? ? ?Discharge Instructions   ? ?  ?You dizzy spells could be coming from your anemia (iron deficiency).  Continue taking the iron supplements.  I think that blood clot is unlikely, but I've ordered the ultrasound study.  Follow the directions below. ? ?Please follow-up with your doctor.  ? ? ? ?  ?Roxy Horseman, PA-C ?12/06/21 4098 ? ?  ?Pollyann Savoy, MD ?12/06/21 [phone] ? ?

## 2023-05-01 IMAGING — CT CT HEAD W/O CM
3 series · 14 of 47 positions shown, 16 images · non-contrast
Comparison: None.

CLINICAL DATA: Headache

EXAM:
CT HEAD WITHOUT CONTRAST
TECHNIQUE: Contiguous axial images were obtained from the base of the skull
through the vertex without intravenous contrast.

[Series 2: head wo · axial · 0.45mm/px · z∈[+1586,+1716]mm · 8 of 32 slices shown, 10 images]
[im 3/32  brain]
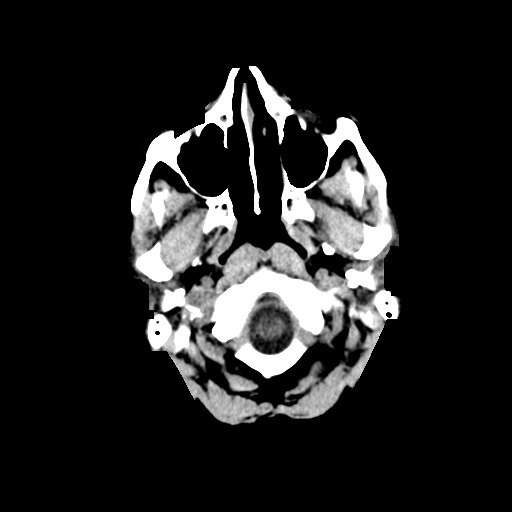
[im 3/32  bone]
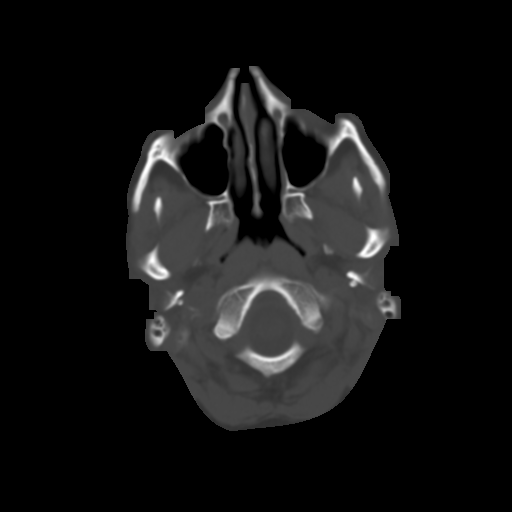
[im 7/32  brain]
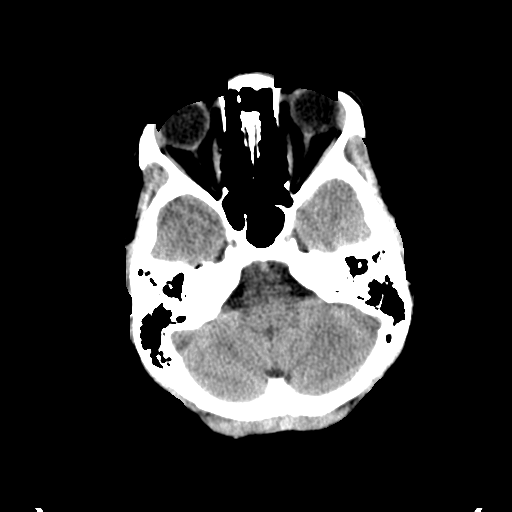
[im 10/32  brain]
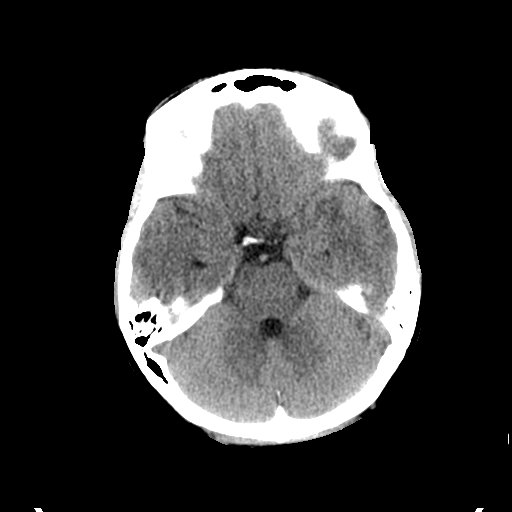
[im 14/32  brain]
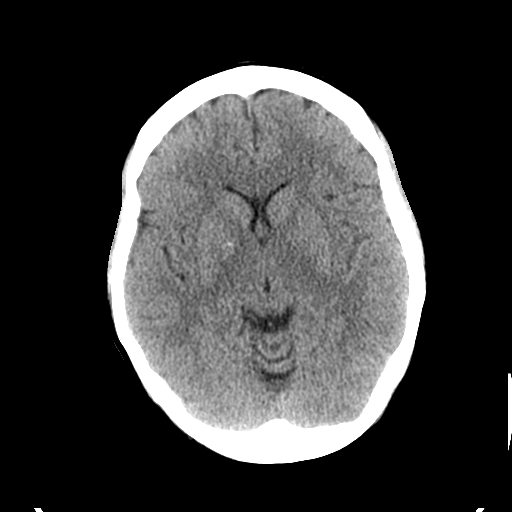
[im 18/32  brain]
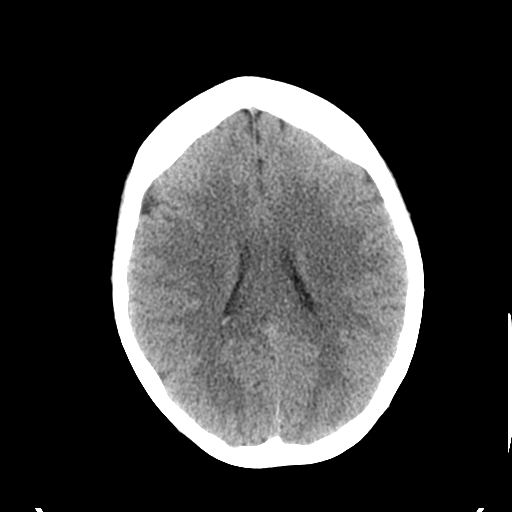
[im 18/32  bone]
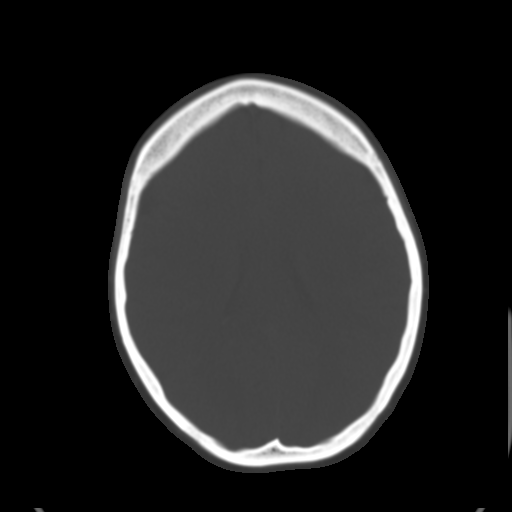
[im 22/32  brain]
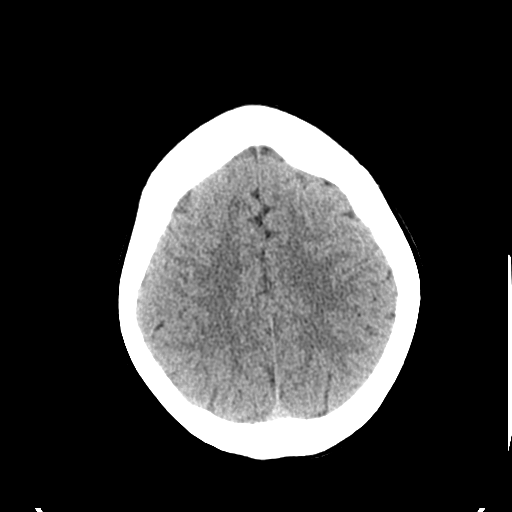
[im 25/32  brain]
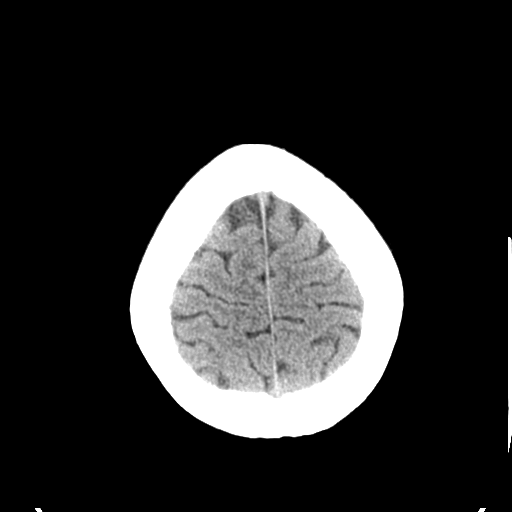
[im 29/32  brain]
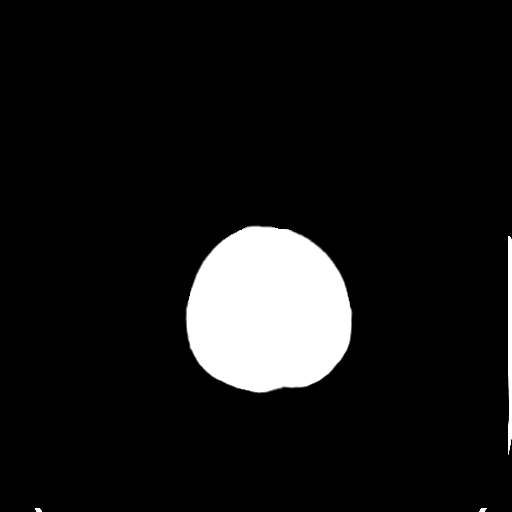

[Series 4: coronal soft tissue · coronal · 0.30mm/px · 3 of 78 slices shown]
[im 26/78  brain]
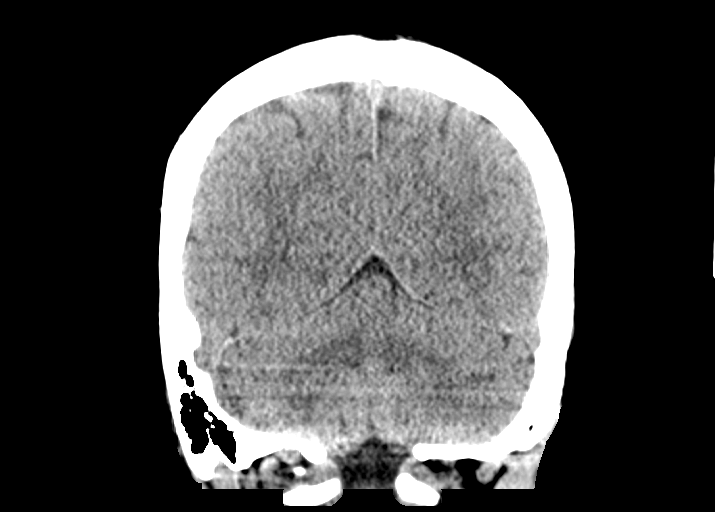
[im 35/78  brain]
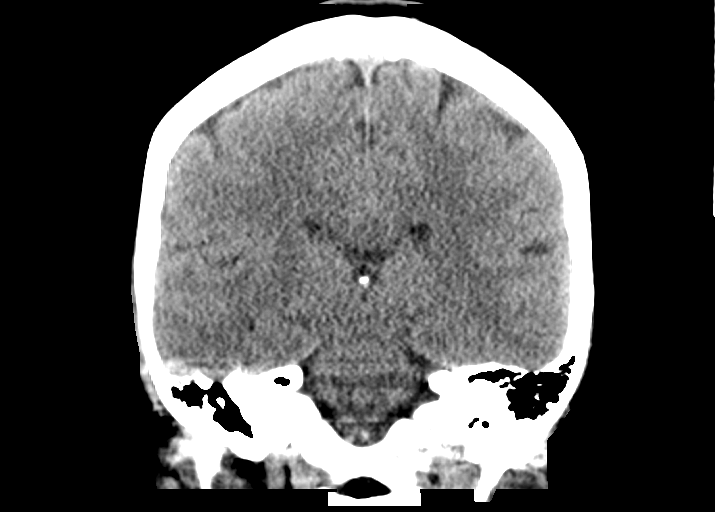
[im 43/78  brain]
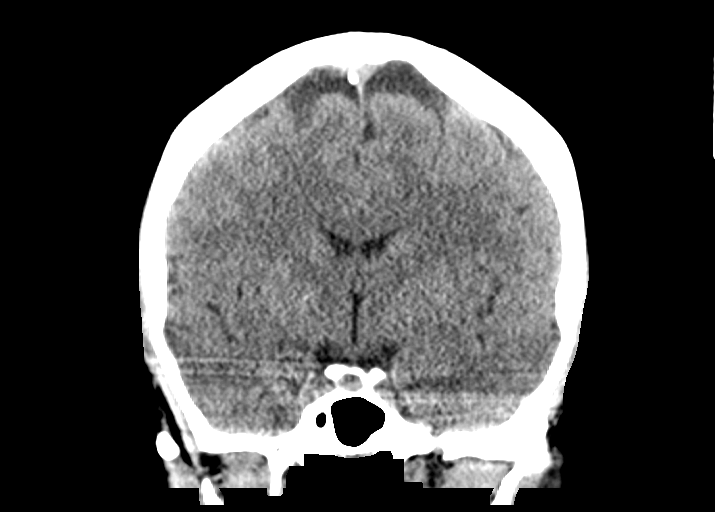

[Series 5: sagittal soft tissue · sagittal · 0.30mm/px · 3 of 71 slices shown]
[im 24/71  brain]
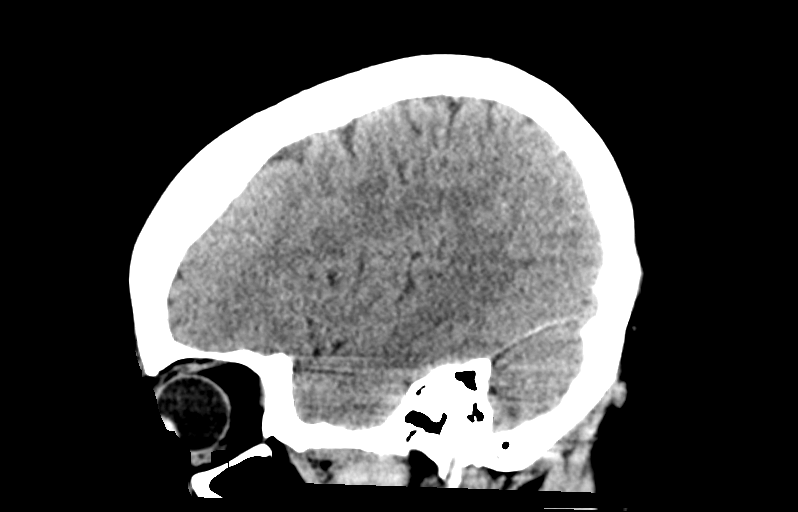
[im 36/71  brain]
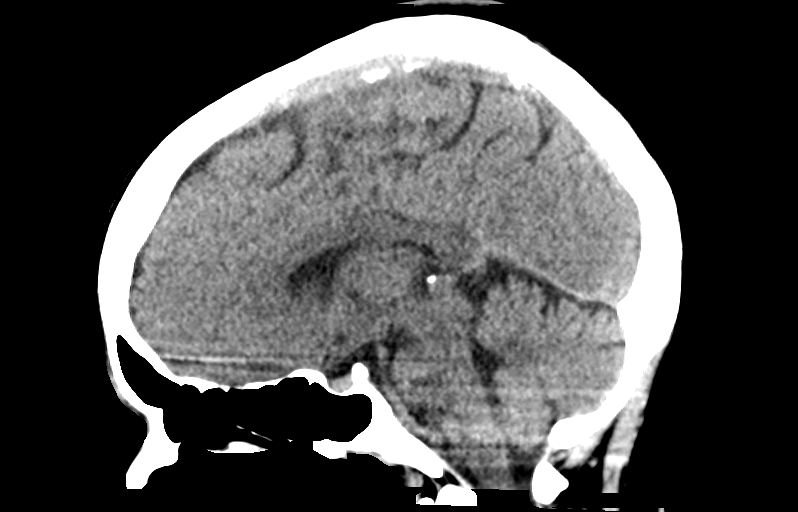
[im 47/71  brain]
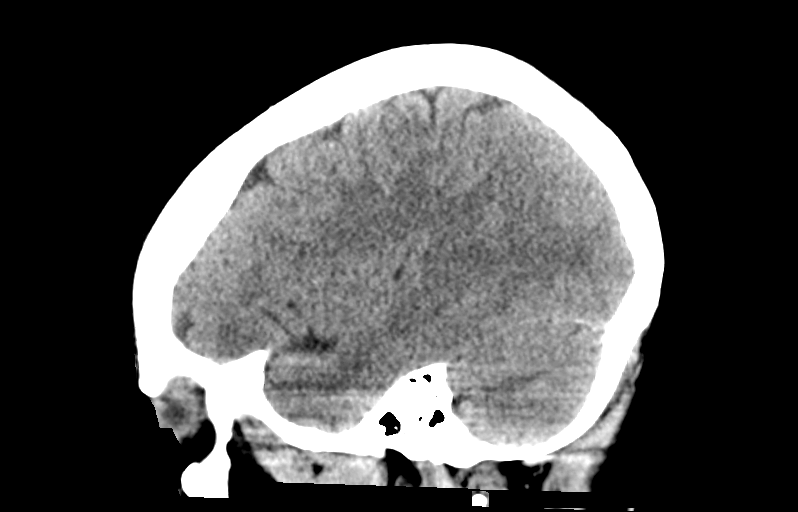

[14 of 47 positions shown; findings below may reference images not displayed]

FINDINGS: Brain: No evidence of acute infarction, hemorrhage, hydrocephalus,
extra-axial collection or mass lesion/mass effect.

Vascular: No hyperdense vessel or unexpected calcification.

Skull: Normal. Negative for fracture or focal lesion.

Sinuses/Orbits: No acute finding.

Other: None
IMPRESSION: Negative non contrasted CT appearance of the brain

## 2024-02-20 ENCOUNTER — Ambulatory Visit: Admitting: Internal Medicine

## 2024-06-05 ENCOUNTER — Emergency Department (HOSPITAL_COMMUNITY)

## 2024-06-05 ENCOUNTER — Emergency Department (HOSPITAL_COMMUNITY)
Admission: EM | Admit: 2024-06-05 | Discharge: 2024-06-05 | Disposition: A | Discharge status: Home / Self Care | Attending: Emergency Medicine | Admitting: Emergency Medicine

## 2024-06-05 ENCOUNTER — Other Ambulatory Visit: Payer: Self-pay

## 2024-06-05 DIAGNOSIS — E162 Hypoglycemia, unspecified: Secondary | ICD-10-CM | POA: Diagnosis not present

## 2024-06-05 DIAGNOSIS — G43809 Other migraine, not intractable, without status migrainosus: Secondary | ICD-10-CM | POA: Diagnosis not present

## 2024-06-05 DIAGNOSIS — R519 Headache, unspecified: Secondary | ICD-10-CM | POA: Diagnosis present

## 2024-06-05 DIAGNOSIS — G43109 Migraine with aura, not intractable, without status migrainosus: Secondary | ICD-10-CM

## 2024-06-05 DIAGNOSIS — I671 Cerebral aneurysm, nonruptured: Secondary | ICD-10-CM | POA: Diagnosis not present

## 2024-06-05 LAB — APTT: aPTT: 30 s (ref 24–36)

## 2024-06-05 LAB — COMPREHENSIVE METABOLIC PANEL WITH GFR
ALT: 13 U/L (ref 0–44)
AST: 18 U/L (ref 15–41)
Albumin: 3.8 g/dL (ref 3.5–5.0)
Alkaline Phosphatase: 49 U/L (ref 38–126)
Anion gap: 9 (ref 5–15)
BUN: 6 mg/dL (ref 6–20)
CO2: 24 mmol/L (ref 22–32)
Calcium: 9.2 mg/dL (ref 8.9–10.3)
Chloride: 105 mmol/L (ref 98–111)
Creatinine, Ser: 0.68 mg/dL (ref 0.44–1.00)
GFR, Estimated: >60 mL/min (ref >60)
Glucose, Bld: 81 mg/dL (ref 70–99)
Potassium: 3.5 mmol/L (ref 3.5–5.1)
Sodium: 138 mmol/L (ref 135–145)
Total Bilirubin: 0.4 mg/dL (ref 0.0–1.2)
Total Protein: 7.7 g/dL (ref 6.5–8.1)

## 2024-06-05 LAB — PROTIME-INR
INR: 1 (ref 0.8–1.2)
Prothrombin Time: 13.9 s (ref 11.4–15.2)

## 2024-06-05 LAB — DIFFERENTIAL
Abs Immature Granulocytes: 0.01 K/uL (ref 0.00–0.07)
Basophils Absolute: 0 K/uL (ref 0.0–0.1)
Basophils Relative: 0 %
Eosinophils Absolute: 0 K/uL (ref 0.0–0.5)
Eosinophils Relative: 1 %
Immature Granulocytes: 0 %
Lymphocytes Relative: 33 %
Lymphs Abs: 1.8 K/uL (ref 0.7–4.0)
Monocytes Absolute: 0.3 K/uL (ref 0.1–1.0)
Monocytes Relative: 6 %
Neutro Abs: 3.4 K/uL (ref 1.7–7.7)
Neutrophils Relative %: 60 %

## 2024-06-05 LAB — CBC
HCT: 33.8 % — ABNORMAL LOW (ref 36.0–46.0)
Hemoglobin: 10 g/dL — ABNORMAL LOW (ref 12.0–15.0)
MCH: 22.8 pg — ABNORMAL LOW (ref 26.0–34.0)
MCHC: 29.6 g/dL — ABNORMAL LOW (ref 30.0–36.0)
MCV: 77.2 fL — ABNORMAL LOW (ref 80.0–100.0)
Platelets: 327 K/uL (ref 150–400)
RBC: 4.38 MIL/uL (ref 3.87–5.11)
RDW: 16.7 % — ABNORMAL HIGH (ref 11.5–15.5)
WBC: 5.6 K/uL (ref 4.0–10.5)
nRBC: 0 % (ref 0.0–0.2)

## 2024-06-05 LAB — CBG MONITORING, ED: Glucose-Capillary: 57 mg/dL — ABNORMAL LOW (ref 70–99)

## 2024-06-05 LAB — ETHANOL: Alcohol, Ethyl (B): <15 mg/dL (ref <15)

## 2024-06-05 LAB — HCG, SERUM, QUALITATIVE: Preg, Serum: NEGATIVE

## 2024-06-05 MED ORDER — GADOBUTROL 1 MMOL/ML IV SOLN
8.0000 mL | Freq: Once | INTRAVENOUS | Status: AC | PRN
  Administered 2024-06-05: 8 mL via INTRAVENOUS

## 2024-06-05 MED ORDER — SODIUM CHLORIDE 0.9% FLUSH
3.0000 mL | Freq: Once | INTRAVENOUS | Status: AC
  Administered 2024-06-05: 3 mL via INTRAVENOUS

## 2024-06-05 NOTE — ED Notes (Signed)
 CCMD called.

## 2024-06-05 NOTE — ED Notes (Signed)
 Transport arrived.

## 2024-06-05 NOTE — ED Provider Notes (Signed)
 [facility] EMERGENCY DEPARTMENT AT [facility] Provider Note   CSN: [id number] Arrival date & time: 06/05/24  1709     Patient presents with: R sided tingling   Brooke Elliott is a 33 y.o. female.   HPI Patient presented with headache and neurologic deficits.  Began on Friday with today being Tuesday.  Reportedly had bad headache in the front of her head.  Took some Motrin but continued pain.  Difficulty sleeping.  Then developed some blurred vision in the right eye.  Later developed some tingling in her right arm.  States also developed some difficulty speaking that lasted for around 20 minutes.  Symptoms somewhat improved now.  Headache also gone.  States symptoms have come and gone on the right side over the last day.  Had been under a lot of stress also.   No past medical history on file.  Prior to Admission medications   Not on File    Allergies: Patient has no known allergies.    Review of Systems  Updated Vital Signs BP 117/67   Pulse 60   Temp 98.1 F (36.7 C) (Oral)   Resp 19   Ht 5' 5 (1.651 m)   Wt 80.7 kg   SpO2 99%   BMI 29.62 kg/m   Physical Exam Vitals reviewed.  Eyes:     Extraocular Movements: Extraocular movements intact.  Cardiovascular:     Rate and Rhythm: Regular rhythm.  Pulmonary:     Effort: Pulmonary effort is normal.  Musculoskeletal:     Cervical back: Neck supple.  Skin:    Capillary Refill: Capillary refill takes less than 2 seconds.  Neurological:     General: No focal deficit present.     Mental Status: She is alert.     Comments: Eye moves intact.  Visual fields grossly intact.  States mild paresthesias right hand but good strength.  Good movements of upper extremities.  Face symmetric.     (all labs ordered are listed, but only abnormal results are displayed) Labs Reviewed  CBC - Abnormal; Notable for the following components:      Result Value   Hemoglobin 10.0 (*)    HCT 33.8 (*)    MCV 77.2 (*)    MCH 22.8  (*)    MCHC 29.6 (*)    RDW 16.7 (*)    All other components within normal limits  CBG MONITORING, ED - Abnormal; Notable for the following components:   Glucose-Capillary 57 (*)    All other components within normal limits  PROTIME-INR  APTT  DIFFERENTIAL  COMPREHENSIVE METABOLIC PANEL WITH GFR  ETHANOL  HCG, SERUM, QUALITATIVE  I-STAT CHEM 8, ED  CBG MONITORING, ED    EKG: EKG Interpretation Date/Time:  Tuesday June 05 2024 20:58:31 EDT Ventricular Rate:  58 PR Interval:  127 QRS Duration:  84 QT Interval:  428 QTC Calculation: 421 R Axis:   21  Text Interpretation: duplicate Confirmed by Patsey Lot [phone]) on 06/05/2024 10:28:21 PM  Radiology: MR ANGIO HEAD WO CONTRAST Result Date: 06/05/2024 EXAM: MR Angiography Head with Intravenous Contrast. CLINICAL HISTORY: Headache, neuro deficit. Pt sent from urgent care; pt states she was diagnosed with ischemic stroke this past Saturday; states still having weakness and tingling in R arm; sent to ED for MRI. 8mL gadavist . TECHNIQUE: Magnetic resonance angiography images of the head with intravenous contrast. Three-dimensional MIP reformations performed. CONTRAST: With 8 mL gadobutrol  (GADAVIST ) 1 MMOL/ML injection. COMPARISON: Concomitant MRI of the brain performed  at the same time. FINDINGS: INTERNAL CAROTID ARTERIES: 4 mm outpouching extending medially from the cavernous right ICA, consistent with a small paraophthalmic aneurysm (series 1, image 114). No significant stenosis. No AVM. ANTERIOR CEREBRAL ARTERIES: No significant stenosis. No aneurysm or AVM. MIDDLE CEREBRAL ARTERIES: No significant stenosis. No aneurysm or AVM. POSTERIOR CEREBRAL ARTERIES: No significant stenosis. No aneurysm or AVM. BASILAR ARTERY: No significant stenosis. No aneurysm or AVM. VERTEBRAL ARTERIES: No significant stenosis. No aneurysm or AVM. IMPRESSION: 1. 4 mm right paraophthalmic aneurysm. 2. Otherwise unremarkable and normal intracranial MRI.  Electronically signed by: Morene Hoard MD 06/05/2024 10:51 PM EDT RP Workstation: [id number]   MR Brain W and Wo Contrast Result Date: 06/05/2024 EXAM: MRI BRAIN WITH AND WITHOUT CONTRAST 06/05/2024 09:59:03 PM TECHNIQUE: Multiplanar multisequence MRI of the head/brain was performed with and without the administration of 8 mL gadobutrol  (GADAVIST ) 1 MMOL/ML injection. COMPARISON: Comparison made with prior CT from earlier the same day as well as previous MRI from 02/13/2021. CLINICAL HISTORY: Headache, neuro deficit. Table formatting from the original note was not included.; Pt sent from urgent care; pt states she was diagnosed with ischemic stroke this past Saturday; states still having weakness and tingling inn R arm; sent to ED for MRI; ; Headache, neuro deficit 8ml gadavist  FINDINGS: BRAIN AND VENTRICLES: No acute infarct. No acute intracranial hemorrhage. No mass effect or midline shift. No hydrocephalus. The sella is unremarkable. Normal flow voids. No mass or abnormal enhancement. ORBITS: No acute abnormality. SINUSES: No acute abnormality. BONES AND SOFT TISSUES: Decreased T1 signal intensity within the visualized bone marrow, nonspecific, but most commonly related to anemia, smoking, or obesity. No acute soft tissue abnormality. IMPRESSION: 1. Normal brain MRI.  No acute intracranial abnormality. Electronically signed by: Morene Hoard MD 06/05/2024 10:46 PM EDT RP Workstation: [id number]   CT HEAD WO CONTRAST Result Date: 06/05/2024 EXAM: CT HEAD WITHOUT CONTRAST 06/05/2024 05:59:00 PM TECHNIQUE: CT of the head was performed without the administration of intravenous contrast. Automated exposure control, iterative reconstruction, and/or weight based adjustment of the mA/kV was utilized to reduce the radiation dose to as low as reasonably achievable. COMPARISON: Head CT 02/27/2021, MRI 02/13/2021 CLINICAL HISTORY: Neuro deficit, acute, stroke suspected. R sided tingling; CT HEAD WO  CONTRAST; Neuro deficit, acute, stroke suspected; See ED Notes:; Pt sent from urgent care; pt states she was diagnosed with ischemic stroke this past Saturday; states still having weakness and tingling inn R arm; sent to ED for MRI; denies new symptoms. FINDINGS: BRAIN AND VENTRICLES: No acute hemorrhage. No evidence of acute infarct. No hydrocephalus. No extra-axial collection. No mass effect or midline shift. ORBITS: No acute abnormality. SINUSES: No acute abnormality. SOFT TISSUES AND SKULL: No acute soft tissue abnormality. No skull fracture. IMPRESSION: 1. No acute intracranial abnormality. Electronically signed by: Luke Bun MD 06/05/2024 06:21 PM EDT RP Workstation: [id number]     Procedures   Medications Ordered in the ED  sodium chloride  flush (NS) 0.9 % injection 3 mL (3 mLs Intravenous Given 06/05/24 2112)  gadobutrol  (GADAVIST ) 1 MMOL/ML injection 8 mL (8 mLs Intravenous Contrast Given 06/05/24 2157)                                    Medical Decision Making Amount and/or Complexity of Data Reviewed Labs: ordered. Radiology: ordered.  Risk Prescription drug management.   Patient with questionable neurologic deficits.  Has had for around 4 days now.  Did have some difficulty speaking.  Did have some right sided paresthesias.  Also had headache.  Differential diagnose includes TIA, MS, complicated migraine or potentially even something stress-related.  Head CT reassuring.  Blood work reassuring although later did have mild hypoglycemia.  Will get MRI with and without contrast and MRA to further evaluate.  If negative I think patient likely can be discharged home.  MRI reassuring.  However MRI does show a small aneurysm.  I think likely no bleed and likely incidental, however will have short-term follow-up with neurosurgery.  Also follow-up with neurology.     Final diagnoses:  Complicated migraine  Brain aneurysm    ED Discharge Orders          Ordered     Ambulatory referral to Neurosurgery       Comments: Please Select To Department: [facility] for Nerve or Spine  Please select To Department: [facility] AT [place] for Cranial or Neurovascular   06/05/24 2302    Ambulatory referral to Neurology       Comments: An appointment is requested in approximately: 2 weeks   06/05/24 2308               Patsey Lot, MD 06/05/24 2310

## 2024-06-05 NOTE — ED Notes (Signed)
 CCMD called for cardiac monitoriing

## 2024-06-05 NOTE — ED Triage Notes (Signed)
 Pt sent from urgent care; pt states she was diagnosed with ischemic stroke this past Saturday; states still having weakness and tingling inn R arm; sent to ED for MRI; denies new symptoms

## 2024-06-05 NOTE — Progress Notes (Signed)
 Brooke Elliott is a 33 y.o.  with  reports that she has never smoked. She has never been exposed to tobacco smoke. She has never used smokeless tobacco. with  Active Ambulatory Problems    Diagnosis Date Noted  . No Active Ambulatory Problems   Resolved Ambulatory Problems    Diagnosis Date Noted  . No Resolved Ambulatory Problems   Past Medical History:  Diagnosis Date  . Anemia    who presents today at Urgent Care for  Chief Complaint  Patient presents with  . Headache    Headache started Friday night, states that it started after an argument. States that she then had the same headache the next day with right arm tingling.  Has only nausea today.       HPI  Brooke Elliott is a 33 year old non-smoker with no history of hypertension who presents to clinic for evaluation of symptoms that started Friday night during an argument.  She had been very stressed about a test she was taking when her mother called.  They argued and Brooke Elliott developed a severe headache on the right side of her head.  She had associated nausea and some very transient visual disturbance.  Headache persisted so she took 600 mg of ibuprofen.  She went to bed and the next day continue to have mild headache and took ibuprofen again.  Around 11 AM Saturday morning, the headache worsened and she began having difficulty speaking.  She noticed right hand numbness and tingling.  She was speaking with her friend and she said that about 50% of her words were normal and the other 50% of her words were random words that made no sense.  She says this episode of dysphasia lasted about 5 minutes.  She did not notice any slurring of the speech at that time and denies facial droop or dizziness or any visual changes during that episode of dysphasia.  Following that episode her headache did improve.  However, she has continued to have a mild headache, nausea, and still has an unusual sensation in her right hand and arm.  She denies any vomiting, fever,  neck stiffness, chest pain, palpitations, abdominal pain, ataxia, or weakness in her arms or legs.  She denies having stroke before but did have an episode of heatstroke several years ago.      reports that she has never smoked. She has never been exposed to tobacco smoke. She has never used smokeless tobacco.  Review of Systems  Review of systems is otherwise negative except as noted in the HPI and Assessment/MDM   Physical Exam  BP 121/67   Pulse 78   Temp 97.9 F (36.6 C) (Tympanic)   Resp 18   Ht 1.651 m (5' 5)   Wt 80.7 kg (177 lb 14.4 oz)   SpO2 100%   BMI 29.60 kg/m   Constitutional:      General: Patient is not in acute distress.    Appearance: Normal appearance.  Neurological:     General: No focal deficit present.     Mental Status: alert and oriented to person, place, and time.     Neuro: Level of consciousness: Alert and Oriented to person, place, and time. No evidence of confusion.  Crainial nerves: Visual fields intact, Visual acuities are grossly intact to finger. PERRLA, EOMI. Sensation intact to light touch in the V1,V2, V3 distribution. Patient smiles/frowns, elevates eyebrows symmetrically and closes eyes tightly against force. Hearing intact to finger rub bilaterally. Palate and Uvula elevate midline/symmetrically.  Patient able to shrug shoulders, and move head left/right against force. Tongue protrudes midline.  Cerebellar: Rapid alternating movements are intact. Gait is normal. No evidence of truncal ataxia with eyes closed sitting up in bed. Heel to shin are normal and finger to nose is normal. Romberg is negative. No drift.  Reflexes: Symmetric patellar reflexes bilaterally  Sensation: Intact to light touch upper and lower extremity in the proximal and distal areas bilateral.  Motor: 5/5 strength bilaterally shoulder/elbow/wrist flex and extension. 5/5 bilaterally hip/knee/ankle in flex and extension  Speech: no dysarthria   Observed gait  is normal  HENT:     Eyes:     Conjunctiva/sclera: Conjunctivae normal. Pharynx normal    There is no  associated anterior cervical lymphadenopathy    Pupils: Pupils are equal, round, and reactive to light.     TM's normal with no visible effusion  Cardiovascular:     Heart sounds: Normal heart sounds. No murmur heard.    No Lower extremity edema noted Pulmonary:     Effort: Pulmonary effort is normal. No respiratory distress.     Breath sounds: No wheezing, rhonchi or rales.  Abdominal:     General: Bowel sounds are normal. There is no distension.     Tenderness: There is no abdominal tenderness.  Musculoskeletal:        General: Normal range of motion.     Neck:  No rigidity.  Skin:    General: Skin is warm and dry.  Psychiatric:        Mood and Affect: Mood normal.      Xray Results for this visit  No orders to display    Blood and Point of Care over last 48 hours  No results found for this or any previous visit (from the past 48 hours).  DIAGNOSIS/PLAN     1. Wernicke's aphasia  MRI Brain WO Contrast    2. Acute nonintractable headache, unspecified headache type  MRI Brain WO Contrast    3. Numbness and tingling in right hand  MRI Brain WO Contrast     Brooke Elliott is a 33 year old who developed an acute headache during an argument 4 days ago.  3 days ago the headache had persisted and she developed a 5-minute period of word salad while talking to one of her friends.  At the same time she developed some nausea as well as significant numbness and tingling in her right hand and arm.  Her symptoms have almost resolved at this point except for some mild nausea, dull headache, and mild residual right hand tingling.  We discussed that I am very concerned she had a TIA and needs further evaluation.  I recommended she go to [facility] emergency department for further evaluation.  She agrees.  Please see after visit summary where I provided detailed instructions with my concerns  for the emergency department     Urgent Care Disposition:  Follow up with ED

## 2024-06-05 NOTE — Discharge Instructions (Addendum)
 I think the numbness was likely related to either a complicated migraine or potentially some anxiety.  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  Follow-up with neurosurgery for this.

## 2024-06-05 NOTE — ED Triage Notes (Signed)
 Patient reports being diagnosed with an ischemic stroke on Saturday 10/25. Reports R sided tingling that has not resolved. Patient went to urgent care today and the UC provider suggested an MRI.

## 2024-06-05 NOTE — ED Notes (Signed)
Pt verbalized understanding of discharge instructions. Pt ambulated from ed with steady gait.  

## 2024-06-06 ENCOUNTER — Telehealth: Payer: Self-pay

## 2024-06-06 NOTE — Telephone Encounter (Signed)
 Pt called in a with concerns, explained to pt that Dr. Rosslyn will discuss imaging and the nedxt steps at her appointment.

## 2024-06-08 ENCOUNTER — Ambulatory Visit (INDEPENDENT_AMBULATORY_CARE_PROVIDER_SITE_OTHER): Admitting: Neurosurgery

## 2024-06-08 ENCOUNTER — Encounter: Payer: Self-pay | Admitting: Neurosurgery

## 2024-06-08 VITALS — BP 139/87 | HR 73 | Temp 98.4°F | Ht 65.0 in | Wt 173.8 lb

## 2024-06-08 DIAGNOSIS — Z8249 Family history of ischemic heart disease and other diseases of the circulatory system: Secondary | ICD-10-CM | POA: Diagnosis not present

## 2024-06-08 DIAGNOSIS — I671 Cerebral aneurysm, nonruptured: Secondary | ICD-10-CM

## 2024-06-08 DIAGNOSIS — Z049 Encounter for examination and observation for unspecified reason: Secondary | ICD-10-CM

## 2024-06-08 NOTE — Patient Instructions (Signed)
 Please see below for information in regards to your upcoming procedure.  Planned procedure : Diagnostic Angiogram with sedation.   DATE/ TIME: Interventional radiology will call you with a date and a time for this procedure. If you do not hear anything by Wednesday 11/5. Please call so I can follow up on this   Location: [facility] [address]   Eating/ Drinking: You should not eat or drink after midnight (NPO)  before your procedure. You can have small sips of clear liquids with your morning medications but everything else should be avoided.    How to contact us :  If you have any questions/concerns before or after your Procedure you can reach us  at [phone], or you can send a mychart message. We can be reached by phone or mychart 8am-4pm, Monday-Friday.   Registered Nurse: Tinnie HERO RN-BSN Surgery scheduler/Admin Lead: Rosaline Custard Medical Assistants: Jesslyn DEL CMA, Avelina BIRCH CMA Doctors: Dino Sable MD, Nancyann Burns MD

## 2024-06-08 NOTE — Progress Notes (Signed)
 Assessment : Discussed the use of AI scribe software for clinical note transcription with the patient, who gave verbal consent to proceed.  History of Present Illness Brooke Elliott is a 33 year old female who presents with symptoms suggestive of a transient ischemic attack and a recent diagnosis of a cerebral aneurysm.  She experienced an episode on Saturday while driving, initially noticing blurry vision in her right eye, which resolved within one to two minutes. Subsequently, she developed tingling and numbness in her right arm without weakness and had difficulty speaking, managing only a few words at a time. The entire episode lasted approximately ten to fifteen minutes. She sought medical attention at a hospital where she was told that an MRI found a small aneurysm inside her head.  She has a history of migraines, with the last occurrence being twelve years ago. Prior to the episode on Saturday, she experienced a headache that began on Friday night after an argument, which she managed with 600 mg of ibuprofen. The headache resolved after two hours but recurred early Saturday morning. She chose not to take additional medication due to prior ibuprofen use.  In 2022, she experienced a similar episode attributed to a heat stroke, characterized by speech difficulties and emotional distress, during a hot day after being in a cold environment. She has a known history of mitral valve regurgitation, which was diagnosed years ago; the patient thinks she may have been born with it but is unsure.  She denies current medication use and has no history of surgeries. She reports feeling anxious and has been crying frequently since the recent diagnosis of the aneurysm. Family history is significant for cerebral aneurysms, with her grandmother's sister and her grandmother's sister's daughter having had aneurysms. No current headaches. Reports anxiety and emotional distress since the diagnosis of the  aneurysm.    Plan : I am very sorry that she had this episode of complex migraine with right eye blurriness and then right arm numbness and tingling.  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  These aneurysms are in the subarachnoid space and therefore, she is at risk for a subarachnoid hemorrhage.  The good thing is that the aneurysm is only 4 mm.  Unfortunately superior hypophyseal artery aneurysms can rupture and a small size.  Compounding that issue is the fact that her grandmother had an aneurysmal subarachnoid hemorrhage, her grandmother sister had it and the grandmother's sisters daughters had it.  Fortunately the patient's mother herself had a screening MRA and was not found to have 1.  I shared with her that I would like to get a diagnostic angiogram to get a better delineation of where this aneurysm is and based upon that shared with her what the recommendations would be.  She has read about the treatments already and once we have the 3D images I will be able to share with her what the best treatment plan could be for her.  She is very nervous and wants to have something done about this which I completely understand.  I will set her up for this diagnostic angiogram and then make plans accordingly.   Social History   Socioeconomic History   Marital status: Married    Spouse name: Not on file   Number of children: Not on file   Years of education: Not on  file   Highest education level: Not on file  Occupational History   Not on file  Tobacco Use   Smoking status: Never   Smokeless tobacco: Never  Vaping Use   Vaping status: Never Used  Substance and Sexual Activity   Alcohol use: Not Currently   Drug use: Never   Sexual activity: Not Currently  Other Topics Concern   Not on file  Social History  Narrative   Not on file   Social Drivers of Health   Financial Resource Strain: Not on file  Food Insecurity: No Food Insecurity (02/18/2021)   Received from [facility]   Hunger Vital Sign    Within the past 12 months, you worried that your food would run out before you got the money to buy more.: Never true    Within the past 12 months, the food you bought just didn't last and you didn't have money to get more.: Never true  Transportation Needs: Not on file  Physical Activity: Not on file  Stress: Not on file  Social Connections: Unknown (12/07/2021)   [facility] from [facility]   Social Network    Social Network: Not on file  Intimate Partner Violence: Unknown (11/13/2021)   Received from [facility]   HITS    Physically Hurt: Not on file    Insult or Talk Down To: Not on file    Threaten Physical Harm: Not on file    Scream or Curse: Not on file    History reviewed. No pertinent family history.  Allergies  Allergen Reactions   Penicillins     History reviewed. No pertinent past medical history.  History reviewed. No pertinent surgical history.   Physical Exam HENT:     Head: Normocephalic.     Nose: Nose normal.  Eyes:     Pupils: Pupils are equal, round, and reactive to light.  Cardiovascular:     Rate and Rhythm: Normal rate.  Pulmonary:     Effort: Pulmonary effort is normal.  Abdominal:     General: Abdomen is flat.  Musculoskeletal:     Cervical back: Normal range of motion.  Neurological:     Mental Status: She is alert.     Cranial Nerves: Cranial nerves 2-12 are intact.     Sensory: Sensation is intact.     Motor: Motor function is intact.     Coordination: Coordination is intact.        Results for orders placed or performed during the hospital encounter of 06/05/24  MR Brain W and Wo Contrast   Narrative   EXAM: MRI BRAIN WITH AND WITHOUT CONTRAST 06/05/2024 09:59:03 PM  TECHNIQUE: Multiplanar multisequence MRI of the head/brain  was performed with and without the administration of 8 mL gadobutrol  (GADAVIST ) 1 MMOL/ML injection.  COMPARISON: Comparison made with prior CT from earlier the same day as well as previous MRI from 02/13/2021.  CLINICAL HISTORY: Headache, neuro deficit. Table formatting from the original note was not included.; Pt sent from urgent care; pt states she was diagnosed with ischemic stroke this past Saturday; states still having weakness and tingling inn R arm; sent to ED for MRI; ; Headache, neuro deficit 8ml gadavist   FINDINGS:  BRAIN AND VENTRICLES: No acute infarct. No acute intracranial hemorrhage. No mass effect or midline shift. No hydrocephalus. The sella is unremarkable. Normal flow voids. No mass or abnormal enhancement.  ORBITS: No acute abnormality.  SINUSES: No acute abnormality.  BONES AND SOFT TISSUES: Decreased T1 signal  intensity within the visualized bone marrow, nonspecific, but most commonly related to anemia, smoking, or obesity. No acute soft tissue abnormality.  IMPRESSION: 1. Normal brain MRI.  No acute intracranial abnormality.  Electronically signed by: Morene Hoard MD 06/05/2024 10:46 PM EDT RP Workstation: [id number]   MR ANGIO HEAD WO CONTRAST   Narrative   EXAM: MR Angiography Head with Intravenous Contrast.  CLINICAL HISTORY: Headache, neuro deficit. Pt sent from urgent care; pt states she was diagnosed with ischemic stroke this past Saturday; states still having weakness and tingling in R arm; sent to ED for MRI. 8mL gadavist .  TECHNIQUE: Magnetic resonance angiography images of the head with intravenous contrast. Three-dimensional MIP reformations performed.  CONTRAST: With 8 mL gadobutrol  (GADAVIST ) 1 MMOL/ML injection.  COMPARISON: Concomitant MRI of the brain performed at the same time.  FINDINGS:  INTERNAL CAROTID ARTERIES: 4 mm outpouching extending medially from the cavernous right ICA, consistent with a small  paraophthalmic aneurysm (series 1, image 114). No significant stenosis. No AVM.  ANTERIOR CEREBRAL ARTERIES: No significant stenosis. No aneurysm or AVM.  MIDDLE CEREBRAL ARTERIES: No significant stenosis. No aneurysm or AVM.  POSTERIOR CEREBRAL ARTERIES: No significant stenosis. No aneurysm or AVM.  BASILAR ARTERY: No significant stenosis. No aneurysm or AVM.  VERTEBRAL ARTERIES: No significant stenosis. No aneurysm or AVM.  IMPRESSION: 1. 4 mm right paraophthalmic aneurysm. 2. Otherwise unremarkable and normal intracranial MRI.  Electronically signed by: Morene Hoard MD 06/05/2024 10:51 PM EDT RP Workstation: [id number]   CT HEAD WO CONTRAST   Narrative   EXAM: CT HEAD WITHOUT CONTRAST 06/05/2024 05:59:00 PM  TECHNIQUE: CT of the head was performed without the administration of intravenous contrast. Automated exposure control, iterative reconstruction, and/or weight based adjustment of the mA/kV was utilized to reduce the radiation dose to as low as reasonably achievable.  COMPARISON: Head CT 02/27/2021, MRI 02/13/2021  CLINICAL HISTORY: Neuro deficit, acute, stroke suspected. R sided tingling; CT HEAD WO CONTRAST; Neuro deficit, acute, stroke suspected; See ED Notes:; Pt sent from urgent care; pt states she was diagnosed with ischemic stroke this past Saturday; states still having weakness and tingling inn R arm; sent to ED for MRI; denies new symptoms.  FINDINGS:  BRAIN AND VENTRICLES: No acute hemorrhage. No evidence of acute infarct. No hydrocephalus. No extra-axial collection. No mass effect or midline shift.  ORBITS: No acute abnormality.  SINUSES: No acute abnormality.  SOFT TISSUES AND SKULL: No acute soft tissue abnormality. No skull fracture.  IMPRESSION: 1. No acute intracranial abnormality.  Electronically signed by: Luke Bun MD 06/05/2024 06:21 PM EDT RP Workstation: [id number]   Results for orders placed or performed  during the hospital encounter of 02/12/21  MR BRAIN WO CONTRAST   Narrative   CLINICAL DATA:  New onset hand numbness with difficulty speaking, symptoms now resolved  EXAM: MRI HEAD WITHOUT CONTRAST  MRA HEAD WITHOUT CONTRAST  TECHNIQUE: Multiplanar, multi-echo pulse sequences of the brain and surrounding structures were acquired without intravenous contrast. Angiographic images of the Circle of Willis were acquired using MRA technique without intravenous contrast.  COMPARISON:  Head CT from yesterday  FINDINGS: MRI HEAD FINDINGS  Brain: No acute infarction, hemorrhage, hydrocephalus, extra-axial collection or mass lesion. No white matter disease.  Vascular: Normal flow voids.  Skull and upper cervical spine: Normal marrow signal.  Sinuses/Orbits: No acute or significant finding.  MRA HEAD FINDINGS  Anterior circulation: Vessels are smooth and widely patent. Negative for aneurysm or vascular malformation  Posterior circulation: Vessels are smooth  and widely patent. Negative for aneurysm or vascular malformation  Anatomic variants: Early branching right MCA  IMPRESSION: Normal brain MRI and MRA.   Electronically Signed   By: Cassondra Roulette M.D.   On: 02/13/2021 07:33

## 2024-06-12 ENCOUNTER — Other Ambulatory Visit (HOSPITAL_COMMUNITY): Payer: Self-pay | Admitting: Neurosurgery

## 2024-06-12 DIAGNOSIS — I671 Cerebral aneurysm, nonruptured: Secondary | ICD-10-CM

## 2024-06-18 ENCOUNTER — Other Ambulatory Visit: Payer: Self-pay

## 2024-06-19 ENCOUNTER — Ambulatory Visit: Admitting: Neurosurgery

## 2024-06-19 ENCOUNTER — Encounter: Payer: Self-pay | Admitting: Neurosurgery

## 2024-06-19 VITALS — BP 124/82 | HR 88 | Temp 97.4°F | Ht 65.0 in | Wt 172.2 lb

## 2024-06-19 DIAGNOSIS — I671 Cerebral aneurysm, nonruptured: Secondary | ICD-10-CM | POA: Diagnosis not present

## 2024-06-19 DIAGNOSIS — I720 Aneurysm of carotid artery: Secondary | ICD-10-CM

## 2024-06-19 NOTE — Progress Notes (Signed)
 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  She came and saw me about a week and a half ago with her mom and at that time I recommended a diagnostic angiogram.  She returns today with her son and is very anxious.  She says that she is very worried that something is going to happen to her.  I explained to her that there is a 0.5% risk of a minor complication and a 0.05% risk of a major complication and I explained these to her.  I told her that it is a very low risk procedure and I think that she is going to be fine.    We briefly talked about endovascular treatment options and she wished to proceed with the angiogram tomorrow

## 2024-06-20 ENCOUNTER — Other Ambulatory Visit (HOSPITAL_COMMUNITY): Payer: Self-pay | Admitting: Neurosurgery

## 2024-06-20 ENCOUNTER — Other Ambulatory Visit: Payer: Self-pay

## 2024-06-20 ENCOUNTER — Ambulatory Visit (HOSPITAL_COMMUNITY)
Admission: RE | Admit: 2024-06-20 | Discharge: 2024-06-20 | Disposition: A | Discharge status: Home / Self Care | Source: Ambulatory Visit | Attending: Neurosurgery | Admitting: Neurosurgery

## 2024-06-20 DIAGNOSIS — I671 Cerebral aneurysm, nonruptured: Secondary | ICD-10-CM

## 2024-06-20 DIAGNOSIS — I720 Aneurysm of carotid artery: Secondary | ICD-10-CM | POA: Diagnosis present

## 2024-06-20 HISTORY — PX: IR ANGIO VERTEBRAL SEL VERTEBRAL UNI L MOD SED: IMG5367

## 2024-06-20 HISTORY — PX: IR ANGIO VERTEBRAL SEL SUBCLAVIAN INNOMINATE UNI R MOD SED: IMG5365

## 2024-06-20 HISTORY — PX: IR 3D INDEPENDENT WKST: IMG2385

## 2024-06-20 HISTORY — PX: IR US GUIDE VASC ACCESS RIGHT: IMG2390

## 2024-06-20 HISTORY — PX: IR ANGIO INTRA EXTRACRAN SEL COM CAROTID INNOMINATE BILAT MOD SED: IMG5360

## 2024-06-20 LAB — PREGNANCY, URINE: Preg Test, Ur: NEGATIVE

## 2024-06-20 MED ORDER — IOHEXOL 300 MG/ML  SOLN
100.0000 mL | Freq: Once | INTRAMUSCULAR | Status: AC | PRN
  Administered 2024-06-20: 50 mL via INTRA_ARTERIAL

## 2024-06-20 MED ORDER — LIDOCAINE HCL 1 % IJ SOLN
INTRAMUSCULAR | Status: AC
Start: 2024-06-20 — End: 2024-06-20
  Filled 2024-06-20: qty 20

## 2024-06-20 MED ORDER — VERAPAMIL HCL 2.5 MG/ML IV SOLN: INTRA_ARTERIAL | Status: AC | PRN

## 2024-06-20 MED ORDER — NITROGLYCERIN 1 MG/10 ML FOR IR/CATH LAB
INTRA_ARTERIAL | Status: AC
  Filled 2024-06-20: qty 10

## 2024-06-20 MED ORDER — HEPARIN SODIUM (PORCINE) 1000 UNIT/ML IJ SOLN
INTRAMUSCULAR | Status: AC | PRN
  Administered 2024-06-20: 3000 [IU] via INTRAVENOUS

## 2024-06-20 MED ORDER — FENTANYL CITRATE (PF) 100 MCG/2ML IJ SOLN
INTRAMUSCULAR | Status: AC | PRN
  Administered 2024-06-20: 25 ug via INTRAVENOUS

## 2024-06-20 MED ORDER — MIDAZOLAM HCL (PF) 2 MG/2ML IJ SOLN
INTRAMUSCULAR | Status: AC | PRN
  Administered 2024-06-20: 1 mg via INTRAVENOUS

## 2024-06-20 MED ORDER — VERAPAMIL HCL 2.5 MG/ML IV SOLN
INTRAVENOUS | Status: AC
Start: 2024-06-20 — End: 2024-06-20
  Filled 2024-06-20: qty 2

## 2024-06-20 MED ORDER — IOHEXOL 300 MG/ML  SOLN
100.0000 mL | Freq: Once | INTRAMUSCULAR | Status: AC | PRN
  Administered 2024-06-20: 18 mL via INTRA_ARTERIAL

## 2024-06-20 MED ORDER — MIDAZOLAM HCL 2 MG/2ML IJ SOLN
INTRAMUSCULAR | Status: AC
  Filled 2024-06-20: qty 2

## 2024-06-20 MED ORDER — HEPARIN SODIUM (PORCINE) 1000 UNIT/ML IJ SOLN
INTRAMUSCULAR | Status: AC
  Filled 2024-06-20: qty 10

## 2024-06-20 MED ORDER — LIDOCAINE HCL 1 % IJ SOLN
20.0000 mL | Freq: Once | INTRAMUSCULAR | Status: AC
  Administered 2024-06-20: 3 mL via INTRADERMAL

## 2024-06-20 MED ORDER — FENTANYL CITRATE (PF) 100 MCG/2ML IJ SOLN
INTRAMUSCULAR | Status: AC
  Filled 2024-06-20: qty 2

## 2024-06-20 NOTE — H&P (Signed)
 33yo lady with right carotid aneurysm  No past medical history on file. Psh Social History   Socioeconomic History   Marital status: Married    Spouse name: Not on file   Number of children: 2   Years of education: Not on file   Highest education level: Not on file  Occupational History   Not on file  Tobacco Use   Smoking status: Never   Smokeless tobacco: Never  Vaping Use   Vaping status: Never Used  Substance and Sexual Activity   Alcohol use: Not Currently   Drug use: Never   Sexual activity: Not Currently  Other Topics Concern   Not on file  Social History Narrative   Not on file   Social Drivers of Health   Financial Resource Strain: Not on file  Food Insecurity: No Food Insecurity (02/18/2021)   Received [facility]   Hunger Vital Sign    Within the past 12 months, you worried that your food would run out before you got the money to buy more.: Never true    Within the past 12 months, the food you bought just didn't last and you didn't have money to get more.: Never true  Transportation Needs: Not on file  Physical Activity: Not on file  Stress: Not on file  Social Connections: Unknown (12/07/2021)   Received from [facility]   Social Network    Social Network: Not on file  Intimate Partner Violence: Unknown (11/13/2021)   Received from [facility]   HITS    Physically Hurt: Not on file    Insult or Talk Down To: Not on file    Threaten Physical Harm: Not on file    Scream or Curse: Not on file   No family history on file. Allergies  Allergen Reactions   Penicillins    Scheduled Meds: Continuous Infusions: PRN Meds:. There were no vitals filed for this visit. Physical Exam HENT:     Head: Normocephalic.     Nose: Nose normal.  Eyes:     Pupils: Pupils are equal, round, and reactive to light.  Cardiovascular:     Rate and Rhythm: Normal rate.  Pulmonary:     Effort: Pulmonary effort is normal.  Abdominal:     General: Abdomen is flat.   Musculoskeletal:     Cervical back: Normal range of motion.  Neurological:     Mental Status: She is alert.   ASSESSMENT: CAROTID ANEURYSM  PLAN: RIGHT CAROTID ANEURYSM  ASA: 1 M: 1

## 2024-06-20 NOTE — Progress Notes (Signed)
 TR BAND REMOVAL  LOCATION:    right radial  DEFLATED PER PROTOCOL:    Yes.    TIME BAND OFF / DRESSING APPLIED: 06/20/24 at 1425   SITE UPON ARRIVAL:    Level 0  SITE AFTER BAND REMOVAL:    Level 0  CIRCULATION SENSATION AND MOVEMENT:    Within Normal Limits   Yes  COMMENTS:

## 2024-06-29 ENCOUNTER — Telehealth: Payer: Self-pay

## 2024-06-29 NOTE — Telephone Encounter (Signed)
 Patient called and stated that she has a headache that started an hour ago.   Patient rates her headache 4/10 and endorses dizziness. She feels like she will faint when she is standing.   Her eye feels like she has some pressure that is along with these headaches. Patient states that she is not sure if she has had these symptoms before. She does not have any visual changes at this time.   She denies nausea or vomiting.   Patient denies any stroke symptoms such as unilateral weakness, slurred speech, and facial dropping. She denies these symptoms.  She works at a [employer] and had her friend take her blood pressure. BP: 116/82 manual while sitting.   I asked her if she had been properly hydrated and she said no and that she had just had some coffee. I educated her about caffeine being a dehydrating agent so when you drink it when already being dehydrated it can lead to further dehydration. She indicates understanding  We discussed symptoms that would need emergency intervention such as having the worst headache of her life, visual changes such as blurred speech, double vision or loss of vision. We also discussed that she   She is going to treat her headache with OTC medications, rehydrate and then call me if she has the same or any worsening symptoms.

## 2024-07-03 ENCOUNTER — Encounter: Payer: Self-pay | Admitting: Neurosurgery

## 2024-07-03 ENCOUNTER — Ambulatory Visit (INDEPENDENT_AMBULATORY_CARE_PROVIDER_SITE_OTHER): Admitting: Neurosurgery

## 2024-07-03 VITALS — BP 123/72 | HR 85 | Ht 65.0 in | Wt 177.0 lb

## 2024-07-03 DIAGNOSIS — I728 Aneurysm of other specified arteries: Secondary | ICD-10-CM | POA: Diagnosis not present

## 2024-07-03 DIAGNOSIS — I720 Aneurysm of carotid artery: Secondary | ICD-10-CM

## 2024-07-03 NOTE — Progress Notes (Signed)
 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  I reviewed the images with her and her mom.  Given the options of doing nothing with serial imaging versus endovascular treatment with flow diversion.  We talked about the risks and benefits of each.  She took some pictures of this aneurysm as well.  I told her that if she can take her mind off of it and not be nervous about it every single day, we can do annual imaging.  However, if she is going to be walking on eggshells every day and be worried about the rupture, then probably treating it would be the best way to go.  We talked about the side effects of the medication [Plavix for 7 days prior, 3 months after and then transition to a baby aspirin a day for a year], and we talked about the perioperative phase as well. I talked to her about the risks of the procedure as well as the less than 1% risk of rupture and delay of this aneurysm.  We decided that she is going to go home and think about this and I will see her back in 2 weeks.

## 2024-07-17 ENCOUNTER — Encounter: Payer: Self-pay | Admitting: Neurosurgery

## 2024-07-17 ENCOUNTER — Ambulatory Visit: Admitting: Neurosurgery

## 2024-07-17 VITALS — BP 126/76 | HR 74 | Ht 65.0 in | Wt 173.0 lb

## 2024-07-17 DIAGNOSIS — I720 Aneurysm of carotid artery: Secondary | ICD-10-CM

## 2024-07-17 MED ORDER — CLOPIDOGREL BISULFATE 75 MG PO TABS: 75.0000 mg | ORAL_TABLET | Freq: Every day | ORAL | 0 refills | Status: AC

## 2024-07-17 NOTE — Progress Notes (Signed)
 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  She was very nervous about it and I had a long conversation with her and her mom about it last time she was here.  She was gena go home and think about her options.  Today she returns and again we have an extensive conversation about doing nothing with serial imaging versus treatment with pipeline embolization [flow diversion].  I again went over the risks and benefits of each option and the statistic.  I again reminded her that the risk of hemorrhage from this aneurysm is less than 1% a year and it is very reasonable to pursue observation.  On the other hand, at her age is also reasonable to pursue treatment for this aneurysm and I quoted her a 5% risk of complications from flow diversion.  I reminded her that with Plavix  her menstrual periods may get heavier but she states that her hemoglobin is above 10 and therefore lesser of a concern.  We discussed this in great detail and she has decided that she wants to proceed with treatment for this aneurysm.  We talked about the perioperative course and I reminded her that she has to take Plavix  7 days prior to treatment and then for 3 months thereafter.  I reminded her that she cannot stop Plavix  during those 3 months.  I plan on doing this procedure in January.

## 2024-07-24 ENCOUNTER — Ambulatory Visit: Payer: Self-pay

## 2024-07-24 ENCOUNTER — Ambulatory Visit: Admitting: Neurosurgery

## 2024-07-24 NOTE — Telephone Encounter (Signed)
 FYI Only or Action Required?: Action required by provider: request for appointment. Copied from CRM #[id number]. Topic: Clinical - Red Word Triage >> Jul 24, 2024  4:19 PM Alexandria E wrote: [facility] that prompted transfer to Nurse Triage: Patient has a brain aneurysm. Heightened anxiety due to upcoming surgery. Patient is also anemic, experiencing dizziness. Reason for Disposition  Health information question, no triage required and triager able to answer question  Answer Assessment - Initial Assessment Questions 1. REASON FOR CALL: What is the main reason for your call? or How can I best help you?     Pt called today to attempt to establish new pt appt with Bernardino [facility] of [facility].  Pt stated she has upcoming surgery and would like to speak with provider about surgery and have some tests run to possible get cleared or medical opinion on surgery.  Nurse informed pt that provider is no longer taking patients at present time.  Pt stated she had an appointment with him in the past and had to cancel, her mom also is a pt of his and would like us  to send a note to see if provider would allow patient to be scheduled as new patient.  Please call patient to schedule if provider approves.  Protocols used: Information Only Call - No Triage-A-AH

## 2024-07-26 ENCOUNTER — Other Ambulatory Visit (HOSPITAL_COMMUNITY): Payer: Self-pay | Admitting: Neurosurgery

## 2024-07-26 ENCOUNTER — Telehealth (HOSPITAL_COMMUNITY): Payer: Self-pay | Admitting: Radiology

## 2024-07-26 DIAGNOSIS — I720 Aneurysm of carotid artery: Secondary | ICD-10-CM

## 2024-07-26 NOTE — Telephone Encounter (Signed)
 Called pt to schedule RCA aneurysm embolization with Dr. Janjua on 08/15/24. Left VM for her to call back to set this up. JM

## 2024-07-27 ENCOUNTER — Ambulatory Visit: Admitting: Neurosurgery

## 2024-07-27 ENCOUNTER — Encounter: Payer: Self-pay | Admitting: Neurosurgery

## 2024-07-27 VITALS — BP 127/86 | HR 79 | Temp 98.3°F | Ht 65.0 in | Wt 174.8 lb

## 2024-07-27 DIAGNOSIS — I671 Cerebral aneurysm, nonruptured: Secondary | ICD-10-CM | POA: Diagnosis not present

## 2024-07-27 DIAGNOSIS — I720 Aneurysm of carotid artery: Secondary | ICD-10-CM

## 2024-07-27 NOTE — Progress Notes (Signed)
 33 year old lady with an intracranial small aneurysm which she wants to have treated.  We had a date picked but she went home and she has thought about it.  She says that her anemia is a concern to her which I completely understand and had voiced to her myself.  She says that she is symptomatic from it and I told her that it may be prudent to get it controlled by talking to her primary care doctor and seeing how they can work on improving her menorrhagia.  She is going to go and talk to them and if she decides that they can work on her anemia first, then she is going to call us  and have the date changed.  We also talked about the risk of hemorrhage and stroke and the consequences thereof from the procedure.

## 2024-07-31 ENCOUNTER — Telehealth: Payer: Self-pay

## 2024-07-31 ENCOUNTER — Telehealth (HOSPITAL_COMMUNITY): Payer: Self-pay | Admitting: Radiology

## 2024-07-31 NOTE — Telephone Encounter (Signed)
 Lvm for pt to call and get sche with Dr.Morrison as a new patient since it was approved per pcp. aw

## 2024-07-31 NOTE — Telephone Encounter (Signed)
 Second attempt to reach patient to schedule aneurysm treatment with Dr. Janjua on 08/15/24. Left VM. JM

## 2024-07-31 NOTE — Telephone Encounter (Signed)
 Error

## 2024-08-24 ENCOUNTER — Encounter: Payer: Self-pay | Admitting: Internal Medicine

## 2024-08-24 ENCOUNTER — Ambulatory Visit: Admitting: Internal Medicine

## 2024-08-24 VITALS — Ht 65.0 in

## 2024-08-24 DIAGNOSIS — R5382 Chronic fatigue, unspecified: Secondary | ICD-10-CM | POA: Diagnosis not present

## 2024-08-24 DIAGNOSIS — K08409 Partial loss of teeth, unspecified cause, unspecified class: Secondary | ICD-10-CM

## 2024-08-24 DIAGNOSIS — D509 Iron deficiency anemia, unspecified: Secondary | ICD-10-CM | POA: Diagnosis not present

## 2024-08-24 DIAGNOSIS — L709 Acne, unspecified: Secondary | ICD-10-CM

## 2024-08-24 DIAGNOSIS — G43109 Migraine with aura, not intractable, without status migrainosus: Secondary | ICD-10-CM

## 2024-08-24 DIAGNOSIS — N920 Excessive and frequent menstruation with regular cycle: Secondary | ICD-10-CM

## 2024-08-24 DIAGNOSIS — F802 Mixed receptive-expressive language disorder: Secondary | ICD-10-CM | POA: Insufficient documentation

## 2024-08-24 DIAGNOSIS — Z8673 Personal history of transient ischemic attack (TIA), and cerebral infarction without residual deficits: Secondary | ICD-10-CM

## 2024-08-24 DIAGNOSIS — E559 Vitamin D deficiency, unspecified: Secondary | ICD-10-CM | POA: Diagnosis not present

## 2024-08-24 DIAGNOSIS — E349 Endocrine disorder, unspecified: Secondary | ICD-10-CM | POA: Diagnosis not present

## 2024-08-24 DIAGNOSIS — I720 Aneurysm of carotid artery: Secondary | ICD-10-CM

## 2024-08-24 DIAGNOSIS — I340 Nonrheumatic mitral (valve) insufficiency: Secondary | ICD-10-CM | POA: Insufficient documentation

## 2024-08-24 DIAGNOSIS — Z8249 Family history of ischemic heart disease and other diseases of the circulatory system: Secondary | ICD-10-CM | POA: Diagnosis not present

## 2024-08-24 DIAGNOSIS — M67874 Other specified disorders of tendon, left ankle and foot: Secondary | ICD-10-CM | POA: Insufficient documentation

## 2024-08-24 DIAGNOSIS — M51369 Other intervertebral disc degeneration, lumbar region without mention of lumbar back pain or lower extremity pain: Secondary | ICD-10-CM | POA: Insufficient documentation

## 2024-08-24 LAB — CBC WITH DIFFERENTIAL/PLATELET
Basophils Absolute: 0 K/uL (ref 0.0–0.1)
Basophils Relative: 0.3 % (ref 0.0–3.0)
Eosinophils Absolute: 0 K/uL (ref 0.0–0.7)
Eosinophils Relative: 0.6 % (ref 0.0–5.0)
HCT: 32.7 % — ABNORMAL LOW (ref 36.0–46.0)
Hemoglobin: 10.4 g/dL — ABNORMAL LOW (ref 12.0–15.0)
Lymphocytes Relative: 33.4 % (ref 12.0–46.0)
Lymphs Abs: 1.5 K/uL (ref 0.7–4.0)
MCHC: 31.9 g/dL (ref 30.0–36.0)
MCV: 76.3 fl — ABNORMAL LOW (ref 78.0–100.0)
Monocytes Absolute: 0.3 K/uL (ref 0.1–1.0)
Monocytes Relative: 6.7 % (ref 3.0–12.0)
Neutro Abs: 2.7 K/uL (ref 1.4–7.7)
Neutrophils Relative %: 59 % (ref 43.0–77.0)
Platelets: 241 K/uL (ref 150.0–400.0)
RBC: 4.28 Mil/uL (ref 3.87–5.11)
RDW: 17.8 % — ABNORMAL HIGH (ref 11.5–15.5)
WBC: 4.5 K/uL (ref 4.0–10.5)

## 2024-08-24 LAB — C-REACTIVE PROTEIN: CRP: <0.5 mg/dL — ABNORMAL LOW (ref 1.0–20.0)

## 2024-08-24 LAB — COMPREHENSIVE METABOLIC PANEL WITH GFR
ALT: 14 U/L (ref 3–35)
AST: 16 U/L (ref 5–37)
Albumin: 4.5 g/dL (ref 3.5–5.2)
Alkaline Phosphatase: 50 U/L (ref 39–117)
BUN: 8 mg/dL (ref 6–23)
CO2: 23 meq/L (ref 19–32)
Calcium: 9.5 mg/dL (ref 8.4–10.5)
Chloride: 107 meq/L (ref 96–112)
Creatinine, Ser: 0.69 mg/dL (ref 0.40–1.20)
GFR: 112.92 mL/min
Glucose, Bld: 80 mg/dL (ref 70–99)
Potassium: 3.7 meq/L (ref 3.5–5.1)
Sodium: 138 meq/L (ref 135–145)
Total Bilirubin: 0.5 mg/dL (ref 0.2–1.2)
Total Protein: 8 g/dL (ref 6.0–8.3)

## 2024-08-24 LAB — RETICULOCYTES
ABS Retic: 64350 {cells}/uL (ref 20000–80000)
Retic Ct Pct: 1.5 %

## 2024-08-24 LAB — LIPID PANEL
Cholesterol: 155 mg/dL (ref 28–200)
HDL: 56.3 mg/dL
LDL Cholesterol: 84 mg/dL (ref 10–99)
NonHDL: 98.9
Total CHOL/HDL Ratio: 3
Triglycerides: 73 mg/dL (ref 10.0–149.0)
VLDL: 14.6 mg/dL (ref 0.0–40.0)

## 2024-08-24 MED ORDER — ADAPALENE 0.1 % EX CREA: TOPICAL_CREAM | Freq: Every day | CUTANEOUS | 0 refills | Status: AC

## 2024-08-24 MED ORDER — ACCRUFER 30 MG PO CAPS: 1.0000 | ORAL_CAPSULE | Freq: Two times a day (BID) | ORAL | 3 refills | Status: AC

## 2024-08-24 MED ORDER — ZAVZPRET 10 MG/ACT NA SOLN: 1.0000 | Freq: Every day | NASAL | 1 refills | Status: AC | PRN

## 2024-08-24 NOTE — Assessment & Plan Note (Signed)
 She experiences migraines with stroke-like symptoms, including right hand numbness and speech difficulties, which resolve spontaneously. The MRI revealed a carotid artery aneurysm, but symptoms are attributed to the migraine. Avoid Imitrex due to the aneurysm and use a nasal spray for acute migraine management. Prior Authorization Letter for Zavzpret  (Zavegepant) Nasal Spray RE: Prior Authorization Request for Zavzpret  (Zavegepant) 10 mg Nasal Spray   Date: August 24, 2024  To Whom It May Concern:  I am writing to request prior authorization for Zavzpret  (zavegepant) 10 mg nasal spray for the acute treatment of migraine in the above-referenced patient. I am requesting an exception to standard step therapy requirements based on absolute medical contraindications to first-line vasoconstrictive migraine therapies.  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 She has a documented growing carotid artery aneurysm and a history of brain aneurysm. Her migraines are associated with significant functional impairment and require rapid, effective acute treatment. The diagnostic challenge of distinguishing her migraine aura from true cerebrovascular events necessitates particularly careful medication selection.  Medical Necessity for Zavzpret :  Zavegepant is a calcitonin gene-related peptide (CGRP) receptor antagonist that is non-vasoconstrictive and represents the only FDA-approved intranasal gepant for acute migraine treatment.[1] This patient has absolute contraindications to standard first-line vasoconstrictive therapies:  1. Triptans are contraindicated in patients with cerebrovascular disease, including those with cerebral aneurysms. The FDA explicitly lists stroke, TIA, and cerebrovascular disease as contraindications to triptan use. Clinical practice guidelines from the American Academy of Neurology, American Headache  Society, and Department of [facility] all state that triptans should not be prescribed to patients with known or suspected cerebrovascular disease because of their vasoconstrictive effects and potential to increase the risk of cerebrovascular events.[2][3][4][5]  2. Ergot derivatives (ergotamine, dihydroergotamine) are similarly contraindicated due to their potent vasoconstrictive properties and are considered even more problematic than triptans in patients with vascular disease.[6][4]  3. NSAIDs have limited efficacy as monotherapy and do not address the need for rapid onset of action in this patient whose attacks mimic TIAs and require prompt differentiation from true cerebrovascular events.  Why Step Therapy is Inappropriate:  Standard step therapy protocols typically require trial and failure of triptans before authorizing gepants. However, requiring this patient to trial triptans would be medically inappropriate and potentially dangerous given her documented cerebrovascular disease. The FDA Adverse Event Reporting System has revealed unexpected associations between triptan use and ischemic cerebrovascular events, aneurysms, and artery dissections.[4] While some authors suggest the concern may be excessive, clinical guidelines maintain these contraindications based on the known pharmacology and vascular effects of triptans.[7][8]  Gepants, by their mechanism of action, are non-vasoconstrictive and do not carry the cardiovascular or cerebrovascular contraindications associated with triptans.[6][5][9][10] The [employer], American Headache Society, and other professional societies explicitly recommend gepants as appropriate alternatives when triptans are contraindicated or not tolerated.[10][11][12]  Why Zavzpret  Specifically:  Zavegepant nasal spray offers unique advantages for this patient:  1. Rapid onset of action: Phase 3 clinical trials demonstrate pain relief  beginning at 15 minutes post-dose, with sustained efficacy up to 48 hours. This rapid onset is critical for a patient whose attacks mimic TIAs and require prompt symptom resolution to facilitate clinical assessment.[1]  2. Non-oral formulation: The intranasal route provides early peak plasma concentrations and avoids first-pass metabolism, making it particularly useful for patients with rapidly escalating headache pain or nausea. Clinical guidelines specifically recommend non-oral therapies for attacks with severe nausea, vomiting, or rapidly escalating pain.[1][13][11]  3.  Proven efficacy: In the pivotal phase 3 trial ([id number]), zavegepant 10 mg nasal spray demonstrated statistically significant superiority over placebo for both coprimary endpoints: pain freedom at 2 hours (24% vs 15%, p0.0001) and freedom from most bothersome symptom at 2 hours (40% vs 31%, p=0.0012).[1][13]  4. Favorable safety profile: The most common adverse events were dysgeusia (21%), nasal discomfort (4%), and nausea (3%), all mild and self-limited. Importantly, there was no signal of hepatotoxicity or cardiovascular complications.[1][13]  5. No cardiovascular contraindications: Unlike triptans and ergots, zavegepant has no contraindications in patients with cerebrovascular disease and has been studied safely in populations with vascular comorbidities.[9][10]  Alternative Treatments Considered:  - Oral gepants (ubrogepant, rimegepant): While these are non-vasoconstrictive alternatives, they have slower onset of action compared to the intranasal formulation and may be less effective for rapidly escalating attacks.[1]  - Lasmiditan: This 5-HT1F receptor agonist is non-vasoconstrictive but carries an 8-hour driving restriction and causes dizziness and somnolence in a significant proportion of patients, which could complicate assessment of neurological symptoms in this patient.[5][10]  - NSAIDs and acetaminophen : These have  limited efficacy as monotherapy and slower onset of action.[10][12]  Conclusion:  This patient has absolute medical contraindications to standard first-line vasoconstrictive migraine therapies (triptans and ergots) due to her documented cerebrovascular disease with growing carotid aneurysm. Requiring step therapy with contraindicated medications would be medically inappropriate and potentially harmful. Zavzpret  (zavegepant) nasal spray is the only FDA-approved intranasal non-vasoconstrictive migraine-specific therapy and offers rapid onset of action critical for this patient's clinical presentation.  I respectfully request approval of Zavzpret  10 mg nasal spray without requiring step therapy, as the standard step therapy protocol is contraindicated in this patient. Please contact me if you require any additional clinical information.  References Safety, Tolerability, and Efficacy of Zavegepant 10 Mg Nasal Spray for the Acute Treatment of Migraine in the [place] : A Phase 3, Double-Blind, Randomised, Placebo-Controlled Multicentre Trial. Lipton RB, Croop R, Stock DA, et al. [employer]. Neurology. 2023;22(3):209-217. doi:10.1016/S1474-4422(22)00517-8. Management of Headache (2023). Slater Saucier PhD DNP MSN/Ed PMHCNS PMHNP-BC RN, Mylinda HERO. Antonovich PharmD BCPS, Prentice BROCKS. Buelt DO, et al. Department of [facility]. Practice Guideline Update Summary: Acute Treatment of Migraine in Children and Adolescents: Report of the Guideline Development, Dissemination, and Implementation Subcommittee of the American Academy of Neurology and the American Headache Society. Maylene HERO Barbar ONEIDA, Holler-Managan Y, et al. Neurology. 2019;93(11):487-499. doi:10.1212/WNL.9999999999991904. Migraine. Dodick DW. Lancet [place], [place]). 2018;391(10127):1315-1330. doi:10.1016/S0140-6736(18)30478-1. Diagnosis and Management of Headache: A Review. Robbins MS. JAMA. 2021;325(18):1874-1885. doi:10.1001/jama.7978.8359. Migraine  in Older Adults. Hugger SS, Do TP, Ashina H, et al. The Lancet. Neurology. 2023;22(10):934-945. doi:10.1016/S1474-4422(23)00206-5. Headache, Cerebral Aneurysms, and the Use of Triptans and Ergot Derivatives. Minnie EP. Headache. 2015;55(5):739-47. doi:10.1111/head.87437. The Risks or Lack Thereof of Migraine Treatments in Vascular Disease. Diener HC. Headache. 2020;60(3):649-653. doi:10.1111/head.B5579919. Cardiovascular Disease and Migraine: Are the New Treatments Safe?. Robblee J, Harvey LK. Current Pain and Headache Reports. 2022;26(8):647-655. doi:10.1007/s11916-022-01064-4. Acute Treatments for Episodic Migraine in Adults: A Systematic Review and Meta-analysis. [facility], Halker Dennise BLONDER, Lucina HERO, et al. JAMA. 2021;325(23):2357-2369. doi:10.1001/jama.7978.2060. The American Headache Society Consensus Statement: Update on Integrating New Migraine Treatments Into Clinical Practice. Ailani J, Burch RC, Robbins MS. Headache. 2021;61(7):1021-1039. doi:10.1111/head.14153. Pharmacologic Treatments of Acute Episodic Migraine Headache in Outpatient Settings: A Clinical Guideline From the [employer]. Qaseem A, Rebecka SHILLING, Etxeandia-Ikobaltzeta I, et al. Annals of Internal Medicine. 2025;178(4):571-578. doi:10.7326/ANNALS-24-03095. ZAVZPRET . Food and Drug Administration. Updated date: 2024-03-19.

## 2024-08-24 NOTE — Patient Instructions (Addendum)
 VISIT SUMMARY: During your visit, we discussed your brain aneurysm, anemia, and other health concerns. We reviewed your current symptoms, family history, and treatment options. We also addressed your concerns about your weight, financial constraints, and overall health management.  YOUR PLAN: -CAROTID ARTERY ANEURYSM: A carotid artery aneurysm is a bulge in the wall of the carotid artery. Your aneurysm is small and has a low risk of rupture, so we will monitor it with annual angiograms. Surgery is postponed until your anemia is managed. You are prescribed Plavix  for 3 months before potential surgery and referred to a geneticist for family history evaluation. Avoid using Imitrex and use a nasal spray for migraine management.  -COMPLICATED MIGRAINE: Complicated migraines are severe headaches with stroke-like symptoms. Your symptoms are attributed to migraines, not the aneurysm. Avoid using Imitrex and use a nasal spray for acute migraine management.  -IRON DEFICIENCY ANEMIA: Iron deficiency anemia is a condition where your body lacks enough iron to produce healthy red blood cells. This is likely due to your heavy menstrual periods. You are prescribed Acrypha as a more effective oral iron supplement, and iron infusions may be considered if oral supplementation fails. A comprehensive blood panel is ordered, and you are referred to a hematologist for potential iron infusions.  -MENORRHAGIA: Menorrhagia is heavy menstrual bleeding. This contributes to your iron deficiency anemia. You are prescribed Acrypha for iron supplementation, and hormonal treatment may be considered if iron supplementation is ineffective.  -VITAMIN D DEFICIENCY: Vitamin D deficiency can contribute to fatigue and weakness. A vitamin D level is ordered as part of a comprehensive blood panel.  -NONRHEUMATIC MITRAL VALVE REGURGITATION: Mitral valve regurgitation is a condition where the heart's mitral valve doesn't close tightly, allowing  blood to flow backward. Your condition is mild and does not require intervention. It will not affect your aneurysm surgery.  -ACNE: Acne is a skin condition that occurs when hair follicles become clogged with oil and dead skin cells. You are referred to a dermatologist for management.  -HORMONE IMBALANCE: Hormonal imbalance can contribute to acne and other symptoms. A hormone panel is ordered for assessment.  -DEGENERATION OF LUMBAR INTERVERTEBRAL DISC: This condition involves the breakdown of the discs in your lower spine, which can cause pain. No acute intervention is needed at this time.  -PARTIAL LOSS OF TEETH: You have three cavities that need to be treated to prevent infection during potential surgery. Dental treatment is advised prior to surgery, ensuring procedures are performed without epinephrine due to blood pressure concerns.  -CHRONIC FATIGUE: Chronic fatigue can be caused by multiple factors, including iron deficiency anemia, vitamin D deficiency, and possible hormonal imbalance. A comprehensive blood panel is o rdered to assess underlying causes.  INSTRUCTIONS: Please follow up with the hematologist for potential iron infusions and complete the comprehensive blood panel as ordered. Continue taking Plavix  as prescribed and avoid using Imitrex. Schedule an appointment with a geneticist for family history evaluation and a dermatologist for acne management. Ensure dental treatment is completed prior to potential surgery, and avoid epinephrine during procedures. Monitor your symptoms and report any changes.  Welcome aboard!   Today's visit was a valuable first step in understanding your health and starting your personalized care journey. We discussed your medical history and medications in detail. Given the extensive information, we prioritized addressing your most pressing concerns.  We understood those concerns to be:  New pt  (Pt is present to est care with pcp has iron problems would  like to have check  today ) and Fatigue (Makes here really tired iron levels always low )   Building a Complete Picture  To create the most effective care plan possible, we may need additional information from previous providers. We encouraged you to gather any relevant medical records for your next visit. This will help us  build a more complete picture and develop a personalized plan together. In the meantime, we'll address your immediate concerns and provide resources to help you manage all of your medical issues.  We encourage you to use MyChart to review these efforts, and to help us  find and correct any omissions or errors in your medical chart.  Managing Your Health Over Time  Managing every aspect of your health in a single visit isn't always feasible, but that's okay.  We addressed your most pressing concerns today and charted a course for future care. Acute conditions or preventive care measures may require further attention.  We encourage you to schedule a follow-up visit at your earliest convenience to discuss any unresolved issues.  We strongly encourage participation in annual preventive care visits to help us  develop a more thorough understanding of your health and to help you maintain optimal wellness - please inquire about scheduling your next one with us  at your earliest convenience.  Your Satisfaction Matters  It was a pleasure seeing you today!  Your health and satisfaction will always be my top priorities. If you believe your experience today was worthy of a 5-star rating, Id be grateful for your feedback!  Bernardino KANDICE Cone, MD   Next Steps  Schedule Follow-Up:  We recommend a follow-up appointment in Return in about 1 month (around 09/24/2024) for annual preventive care visit. If your condition worsens before then, please call us  or seek emergency care. Preventive Care:  Don't forget to schedule your annual preventive care visit!  This important checkup is typically covered by  insurance and helps identify potential health issues early.  Typically its 100% insurance covered with no co-pay and helps to get surveillance labwork paid for through your insurance provider.  Sometimes it even lowers your insurance premiums to participate. Medical Information Release:  For any relevant medical information we don't have, please sign a release form so we can obtain it for your records. Lab & X-ray Appointments:  Scheduled any incomplete lab tests today or call us  to schedule.  X-Rays can be done without an appointment at [facility] at [facility] ([address]), M-F 8:30am-noon or 1pm-5pm.  Just tell them you're there for X-rays ordered by Dr. Cone.  We'll receive the results and contact you by phone or MyChart to discuss next steps.  Bring to Your Next Appointment  Medications: Please bring all your medication bottles to your next appointment to ensure we have an accurate record of your prescriptions. Health Diaries: If you're monitoring any health conditions at home, keeping a diary of your readings can be very helpful for discussions at your next appointment.  Reviewing Your Records  Please Review this early draft of your clinical notes below and the final encounter summary tomorrow on MyChart after its been completed.   Complicated migraine Assessment & Plan: Prior Authorization Letter for Zavzpret  (Zavegepant) Nasal Spray  RE: Prior Authorization Request for Zavzpret  (Zavegepant) 10 mg Nasal Spray   Patient: [Patient Name]   Date of Birth: [DOB]   Member ID: [Medicaid ID]   Date: August 24, 2024  To Whom It May Concern:  I am writing to request prior authorization  for Zavzpret  (zavegepant) 10 mg nasal spray for the acute treatment of migraine in the above-referenced patient. I am requesting an exception to standard step therapy requirements based on absolute medical contraindications to first-line vasoconstrictive migraine therapies.  Clinical  History:  This young woman presents with complicated migraines that repeatedly mimic transient ischemic attacks. She has a documented growing carotid artery aneurysm and a history of brain aneurysm. Her migraines are associated with significant functional impairment and require rapid, effective acute treatment. The diagnostic challenge of distinguishing her migraine aura from true cerebrovascular events necessitates particularly careful medication selection.  Medical Necessity for Zavzpret :  Zavegepant is a calcitonin gene-related peptide (CGRP) receptor antagonist that is non-vasoconstrictive and represents the only FDA-approved intranasal gepant for acute migraine treatment.[1] This patient has absolute contraindications to standard first-line vasoconstrictive therapies:  1. Triptans are contraindicated in patients with cerebrovascular disease, including those with cerebral aneurysms. The FDA explicitly lists stroke, TIA, and cerebrovascular disease as contraindications to triptan use. Clinical practice guidelines from the American Academy of Neurology, American Headache Society, and Department of [facility] all state that triptans should not be prescribed to patients with known or suspected cerebrovascular disease because of their vasoconstrictive effects and potential to increase the risk of cerebrovascular events.[2][3][4][5]  2. Ergot derivatives (ergotamine, dihydroergotamine) are similarly contraindicated due to their potent vasoconstrictive properties and are considered even more problematic than triptans in patients with vascular disease.[6][4]  3. NSAIDs have limited efficacy as monotherapy and do not address the need for rapid onset of action in this patient whose attacks mimic TIAs and require prompt differentiation from true cerebrovascular events.  Why Step Therapy is Inappropriate:  Standard step therapy protocols typically require trial and failure of triptans before  authorizing gepants. However, requiring this patient to trial triptans would be medically inappropriate and potentially dangerous given her documented cerebrovascular disease. The FDA Adverse Event Reporting System has revealed unexpected associations between triptan use and ischemic cerebrovascular events, aneurysms, and artery dissections.[4] While some authors suggest the concern may be excessive, clinical guidelines maintain these contraindications based on the known pharmacology and vascular effects of triptans.[7][8]  Gepants, by their mechanism of action, are non-vasoconstrictive and do not carry the cardiovascular or cerebrovascular contraindications associated with triptans.[6][5][9][10] The [employer], American Headache Society, and other professional societies explicitly recommend gepants as appropriate alternatives when triptans are contraindicated or not tolerated.[10][11][12]  Why Zavzpret  Specifically:  Zavegepant nasal spray offers unique advantages for this patient:  1. Rapid onset of action: Phase 3 clinical trials demonstrate pain relief beginning at 15 minutes post-dose, with sustained efficacy up to 48 hours. This rapid onset is critical for a patient whose attacks mimic TIAs and require prompt symptom resolution to facilitate clinical assessment.[1]  2. Non-oral formulation: The intranasal route provides early peak plasma concentrations and avoids first-pass metabolism, making it particularly useful for patients with rapidly escalating headache pain or nausea. Clinical guidelines specifically recommend non-oral therapies for attacks with severe nausea, vomiting, or rapidly escalating pain.[1][13][11]  3. Proven efficacy: In the pivotal phase 3 trial ([id number]), zavegepant 10 mg nasal spray demonstrated statistically significant superiority over placebo for both coprimary endpoints: pain freedom at 2 hours (24% vs 15%, p0.0001) and freedom from most bothersome  symptom at 2 hours (40% vs 31%, p=0.0012).[1][13]  4. Favorable safety profile: The most common adverse events were dysgeusia (21%), nasal discomfort (4%), and nausea (3%), all mild and self-limited. Importantly, there was no signal of hepatotoxicity or cardiovascular complications.[1][13]  5. No cardiovascular  contraindications: Unlike triptans and ergots, zavegepant has no contraindications in patients with cerebrovascular disease and has been studied safely in populations with vascular comorbidities.[9][10]  Alternative Treatments Considered:  - Oral gepants (ubrogepant, rimegepant): While these are non-vasoconstrictive alternatives, they have slower onset of action compared to the intranasal formulation and may be less effective for rapidly escalating attacks.[1]  - Lasmiditan: This 5-HT1F receptor agonist is non-vasoconstrictive but carries an 8-hour driving restriction and causes dizziness and somnolence in a significant proportion of patients, which could complicate assessment of neurological symptoms in this patient.[5][10]  - NSAIDs and acetaminophen : These have limited efficacy as monotherapy and slower onset of action.[10][12]  Conclusion:  This patient has absolute medical contraindications to standard first-line vasoconstrictive migraine therapies (triptans and ergots) due to her documented cerebrovascular disease with growing carotid aneurysm. Requiring step therapy with contraindicated medications would be medically inappropriate and potentially harmful. Zavzpret  (zavegepant) nasal spray is the only FDA-approved intranasal non-vasoconstrictive migraine-specific therapy and offers rapid onset of action critical for this patient's clinical presentation.  I respectfully request approval of Zavzpret  10 mg nasal spray without requiring step therapy, as the standard step therapy protocol is contraindicated in this patient. Please contact me if you require any additional clinical  information.  Sincerely,  [Eybdprpjw Name]  [Credentials]  [Contact Information] References Safety, Tolerability, and Efficacy of Zavegepant 10 Mg Nasal Spray for the Acute Treatment of Migraine in the [place] : A Phase 3, Double-Blind, Randomised, Placebo-Controlled Multicentre Trial. Lipton RB, Croop R, Stock DA, et al. [employer]. Neurology. 2023;22(3):209-217. doi:10.1016/S1474-4422(22)00517-8. Management of Headache (2023). Slater Saucier PhD DNP MSN/Ed PMHCNS PMHNP-BC RN, Mylinda HERO. Antonovich PharmD BCPS, Prentice BROCKS. Buelt DO, et al. Department of [facility]. Practice Guideline Update Summary: Acute Treatment of Migraine in Children and Adolescents: Report of the Guideline Development, Dissemination, and Implementation Subcommittee of the American Academy of Neurology and the American Headache Society. Maylene HERO Barbar ONEIDA, Holler-Managan Y, et al. Neurology. 2019;93(11):487-499. doi:10.1212/WNL.9999999999991904. Migraine. Dodick DW. Lancet [place], [place]). 2018;391(10127):1315-1330. doi:10.1016/S0140-6736(18)30478-1. Diagnosis and Management of Headache: A Review. Robbins MS. JAMA. 2021;325(18):1874-1885. doi:10.1001/jama.7978.8359. Migraine in Older Adults. Hugger SS, Do TP, Ashina H, et al. The Lancet. Neurology. 2023;22(10):934-945. doi:10.1016/S1474-4422(23)00206-5. Headache, Cerebral Aneurysms, and the Use of Triptans and Ergot Derivatives. Minnie EP. Headache. 2015;55(5):739-47. doi:10.1111/head.87437. The Risks or Lack Thereof of Migraine Treatments in Vascular Disease. Diener HC. Headache. 2020;60(3):649-653. doi:10.1111/head.B5579919. Cardiovascular Disease and Migraine: Are the New Treatments Safe?. Robblee J, Harvey LK. Current Pain and Headache Reports. 2022;26(8):647-655. doi:10.1007/s11916-022-01064-4. Acute Treatments for Episodic Migraine in Adults: A Systematic Review and Meta-analysis. [facility], Halker Dennise BLONDER, Lucina HERO, et al. JAMA. 2021;325(23):2357-2369.  doi:10.1001/jama.7978.2060. The American Headache Society Consensus Statement: Update on Integrating New Migraine Treatments Into Clinical Practice. Ailani J, Burch RC, Robbins MS. Headache. 2021;61(7):1021-1039. doi:10.1111/head.14153. Pharmacologic Treatments of Acute Episodic Migraine Headache in Outpatient Settings: A Clinical Guideline From the [employer]. Qaseem A, Rebecka SHILLING, Etxeandia-Ikobaltzeta I, et al. Annals of Internal Medicine. 2025;178(4):571-578. doi:10.7326/ANNALS-24-03095. ZAVZPRET . Food and Drug Administration. Updated date: 2024-03-19.   Orders: -     Comprehensive metabolic panel with GFR -     CBC with Differential/Platelet -     Zavzpret ; Place 1 spray into the nose daily as needed. Take at first symptoms of complicated migraine  Dispense: 1 each; Refill: 1  Carotid artery aneurysm -     Lipid panel -     Comprehensive metabolic panel with GFR -     CBC with Differential/Platelet  Wernicke's aphasia  Tooth missing  Achilles tendinosis  of left ankle  History of TIA (transient ischemic attack)  Degeneration of intervertebral disc of lumbar region, unspecified whether pain present  Iron deficiency anemia, unspecified iron deficiency anemia type -     ACCRUFeR ; Take 1 capsule (30 mg total) by mouth 2 (two) times daily. Recheck iron to decide how long to continue  Dispense: 180 capsule; Refill: 3 -     Iron, TIBC and Ferritin Panel -     Pathologist smear review -     Reticulocytes; Future -     Gliadin antibodies, serum -     Tissue transglutaminase, IgA -     C-reactive protein -     Sedimentation rate  Chronic fatigue -     ACCRUFeR ; Take 1 capsule (30 mg total) by mouth 2 (two) times daily. Recheck iron to decide how long to continue  Dispense: 180 capsule; Refill: 3 -     Iron, TIBC and Ferritin Panel -     Pathologist smear review -     Reticulocytes; Future  Vitamin D deficiency -     Iron, TIBC and Ferritin Panel -      Pathologist smear review -     Reticulocytes; Future  Nonrheumatic mitral valve regurgitation  Acne, unspecified acne type -     Ambulatory referral to Dermatology -     Adapalene ; Apply topically at bedtime.  Dispense: 45 g; Refill: 0  Hormone imbalance -     TSH+Prl+FSH+TestT+LH+DHEA S...  Menorrhagia with regular cycle -     Von Willebrand panel  FH: aneurysm -     Ambulatory referral to Genetics     Getting Answers and Following Up  Simple Questions & Concerns: For quick questions or basic follow-up after your visit, reach us  at [phone] or MyChart messaging. Complex Concerns: If your concern is more complex, scheduling an appointment might be best. Discuss this with the staff to find the most suitable option. Lab & Imaging Results: We'll contact you directly if results are abnormal or you don't use MyChart. Most normal results will be on MyChart within 2-3 business days, with a review message from Dr. Jesus. Haven't heard back in 2 weeks? Need results sooner? Contact us  at [phone]. Referrals: Our referral coordinator will manage specialist referrals. The specialist's office should contact you within 2 weeks to schedule an appointment. Call us  if you haven't heard from them after 2 weeks.  Staying Connected  MyChart: Activate your MyChart for the fastest way to access results and message us . See the last page of this paperwork for instructions.  Billing  X-ray & Lab Orders: These are billed by separate companies. Contact the invoicing company directly for questions or concerns. Visit Charges: Discuss any billing inquiries with our administrative services team.  Feedback & Satisfaction  Share Your Experience: We strive for your satisfaction! If you have any complaints, please let Dr. Jesus know directly or contact our Practice Administrators, Manuelita Rubin or [employer], by asking at the front desk.  Scheduling Tips  Shorter Wait Times: 8 am and 1  pm appointments often have the quickest wait times. Longer Appointments: If you need more time during your visit, talk to the front desk. Due to insurance regulations, multiple back-to-back appointments might be necessary. Zavegepant Nasal Spray What is this medication? ZAVEGEPANT (za VE je pant) treats migraines. It works by blocking a substance in the body that causes migraines. It is not used to prevent migraines. This medicine may be used for other  purposes; ask your health care provider or pharmacist if you have questions. COMMON BRAND NAME(S): ZAVZPRET  What should I tell my care team before I take this medication? They need to know if you have any of these conditions: Circulation problems in fingers or toes (Raynaud syndrome) High blood pressure Kidney disease Liver disease An unusual or allergic reaction to zavegepant, other medications, foods, dyes, or preservatives Pregnant or trying to get pregnant Breastfeeding How should I use this medication? This medication is for use in the nose. Take it as directed on the prescription label. Do not use it more often than directed. Make sure that you are using your nasal spray correctly. Ask your care team if you have any questions. Talk to your care team about the use of this medication in children. Special care may be needed. Overdosage: If you think you have taken too much of this medicine contact a poison control center or emergency room at once. NOTE: This medicine is only for you. Do not share this medicine with others. What if I miss a dose? This does not apply. This medication is not for regular use. It should only be used as needed. What may interact with this medication? Decongestant nasal sprays This medication may affect how other medications work, and other medications may affect the way this medication works. Talk with your care team about all of the medications you take. They may suggest changes to your treatment plan to lower  the risk of side effects and to make sure your medications work as intended. This list may not describe all possible interactions. Give your health care provider a list of all the medicines, herbs, non-prescription drugs, or dietary supplements you use. Also tell them if you smoke, drink alcohol, or use illegal drugs. Some items may interact with your medicine. What should I watch for while using this medication? Visit your care team for regular checks on your progress. Tell your care team if your symptoms do not start to get better or if they get worse. What side effects may I notice from receiving this medication? Side effects that you should report to your care team as soon as possible: Allergic reactions or angioedema--skin rash, itching or hives, swelling of the face, eyes, lips, tongue, arms, or legs, trouble swallowing or breathing Increase in blood pressure Raynaud syndrome--cool, numb, or painful fingers or toes that may change color from pale, to blue, to red Side effects that usually do not require medical attention (report these to your care team if they continue or are bothersome): Change in taste Dryness or irritation inside the nose Nausea Vomiting This list may not describe all possible side effects. Call your doctor for medical advice about side effects. You may report side effects to FDA at 1-800-FDA-1088. Where should I keep my medication? Keep out of the reach of children and pets. Store at room temperature between 20 and 25 degrees C (68 and 77 degrees F). Do not freeze. Get rid of any unused medication after the expiration date. To get rid of medications that are no longer needed or have expired: Take the medication to a medication take-back program. Check with your pharmacy or law enforcement to find a location. If you cannot return the medication, ask your pharmacist or care team how to get rid of this medication safely. NOTE: This sheet is a summary. It may not cover all  possible information. If you have questions about this medicine, talk to your doctor, pharmacist, or health care provider.  2025 Elsevier/Gold Standard (2023-11-03 00:00:00)

## 2024-08-24 NOTE — Progress Notes (Unsigned)
 " [facility]  Phone: [phone]  - Medical Office Visit -  Visit Date: 08/24/2024 Patient: Brooke Elliott   DOB: 18-Nov-1990   34 y.o. Female  MRN: [id number] Patient Care Team: Jesus Bernardino MATSU, MD as PCP - General (Internal Medicine) Today's Health Care Provider: Bernardino MATSU Jesus, MD  ===========================================    Chief Complaint / Reason for Visit: New pt  (Pt is present to est care with pcp has iron problems would like to have check today ) and Fatigue (Makes here really tired iron levels always low )   Background: 34 y.o. female who has Tooth missing; Carotid artery aneurysm; Complicated migraine; History of TIA (transient ischemic attack); Degeneration of intervertebral disc of lumbar region; Iron deficiency anemia; Chronic fatigue; Vitamin D deficiency; Nonrheumatic mitral valve regurgitation; Acne; Hormone imbalance; Menorrhagia with regular cycle; and FH: aneurysm on their problem list.  Discussed the use of AI scribe software for clinical note transcription with the patient, who gave verbal consent to proceed.  History of Present Illness  34 year old female with a brain aneurysm who presents with concerns about her condition and anemia.  She was diagnosed with a brain aneurysm following a severe migraine and transient neurological symptoms, including numbness in her right hand and difficulty speaking, which resolved after 15 minutes. An initial CT scan was unremarkable, but an MRI with contrast revealed the aneurysm located on her carotid artery, measuring 3.3 millimeters. She has a family history of brain aneurysms, with her grandmother and several aunts having had them. A previous MRI in 2021 or 2022 for similar symptoms did not show an aneurysm at that time.  She is experiencing severe anemia, which has delayed her planned surgery for the aneurysm. She has been taking liquid iron supplements for two weeks but finds them expensive. She  experiences dizziness and fatigue, which she attributes to her low iron levels. Her menstrual periods are heavy, lasting five days, with clot passage. Her last iron check showed a ferritin level of 7-8.  She has a history of Achilles tendinitis on the left side due to an injury from a heavy object falling on her, with no current pain. She also mentions having floaters in her vision for several years, which were attributed to a piece of her retina coming out.  She has mild mitral valve regurgitation and is concerned about its impact on her potential surgery. She is currently taking Plavix  in preparation for her surgery, which was initially scheduled for January 7 but postponed due to her anemia.  She is concerned about her weight, noting she is over 170 pounds and unable to engage in heavy exercise due to her current health conditions. 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  Lab Results  Component Value Date   HGB 10.4 (L) 08/24/2024   HGB 10.0 (L) 06/05/2024   HGB 10.5 (L) 12/05/2021   HGB 10.6 (L) 12/01/2021   HGB 11.1 (L) 02/12/2021   HGB 10.9 (L) 01/15/2021   Lab Results  Component Value Date/Time   FERRITIN 4 (L) 08/24/2024 11:16 AM     Problem overviews updated today: Problem  Complicated Migraine  Achilles Tendinosis of Left Ankle (Resolved)  Wernicke's Aphasia (Resolved)    Medications updated/reviewed: Current Outpatient Medications on File Prior to Visit  Medication Sig   clopidogrel  (PLAVIX ) 75 MG tablet Take 1 tablet (75 mg total) by mouth daily for  90 doses. (Patient not taking: Reported on 07/27/2024)   cycloSPORINE (RESTASIS) 0.05 % ophthalmic emulsion Place 1 drop into both eyes 2 (two) times daily. (Patient not taking: Reported on 07/27/2024)   Ibuprofen 200 MG CAPS  (Patient not taking: Reported on 07/27/2024)   Multiple Vitamin tablet Take 1 tablet by  mouth daily. (Patient not taking: Reported on 07/27/2024)   Multiple Vitamins-Minerals (ONE DAILY CALCIUM/IRON) TABS Take 1 tablet by mouth. (Patient not taking: Reported on 07/27/2024)   ondansetron (ZOFRAN-ODT) 4 MG disintegrating tablet Take 4 mg by mouth 2 (two) times daily. (Patient not taking: Reported on 07/27/2024)   Sodium Fluoride (SODIUM FLUORIDE 5000 PPM) 1.1 % PSTE Place onto teeth. (Patient not taking: Reported on 07/27/2024)   No current facility-administered medications on file prior to visit.  There are no discontinued medications. Active Medications[1] Allergies:  Penicillins Past Medical History:  has a past medical history of Achilles tendinosis of left ankle (08/24/2024), Anemia, Aneurysm, Migraine, and Wernicke's aphasia (08/24/2024). Past Surgical History:   has a past surgical history that includes IR US  Guide Vasc Access Right (06/20/2024); IR ANGIO VERTEBRAL SEL VERTEBRAL UNI L MOD SED (06/20/2024); IR ANGIO INTRA EXTRACRAN SEL COM CAROTID INNOMINATE BILAT MOD SED (06/20/2024); IR 3D Independent Wkst (06/20/2024); and IR ANGIO VERTEBRAL SEL SUBCLAVIAN INNOMINATE UNI R MOD SED (06/20/2024). Social History:   reports that she has never smoked. She has never used smokeless tobacco. She reports that she does not currently use alcohol. She reports that she does not use drugs. Family History:  family history includes Alcohol abuse in her brother, father, and maternal grandfather; Aneurysm in her maternal grandmother; Arthritis in her maternal grandmother and mother; Asthma in her sister and son; Hyperlipidemia in her mother; Hypertension in her mother. Depression Screen and Health Maintenance:    08/24/2024   11:41 AM  PHQ 2/9 Scores  PHQ - 2 Score 0  PHQ- 9 Score 1   Health Maintenance  Topic Date Due   HIV Screening  Never done   Hepatitis C Screening  Never done   Pneumococcal Vaccine (1 of 2 - PCV) Never done   Hepatitis B Vaccines 19-59 Average Risk (1 of 3 - 19+  3-dose series) Never done   Cervical Cancer Screening (HPV/Pap Cotest)  Never done   Influenza Vaccine  Never done   COVID-19 Vaccine (1 - 2025-26 season) 09/09/2024 (Originally 04/09/2024)   DTaP/Tdap/Td (2 - Td or Tdap) 02/16/2034   HPV VACCINES (No Doses Required) Completed   Meningococcal B Vaccine  Aged Out   Immunization History  Administered Date(s) Administered   MMR 03/02/2024, 03/30/2024   Tdap 02/17/2024     Objective   Physical ExamHt 5' 5 (1.651 m)   BMI 29.09 kg/m  Wt Readings from Last 10 Encounters:  07/27/24 174 lb 12.8 oz (79.3 kg)  07/17/24 173 lb (78.5 kg)  07/03/24 177 lb (80.3 kg)  06/20/24 175 lb (79.4 kg)  06/19/24 172 lb 3.2 oz (78.1 kg)  06/08/24 173 lb 12.8 oz (78.8 kg)  06/05/24 178 lb (80.7 kg)  02/12/21 146 lb (66.2 kg)  Vital signs reviewed.  Nursing notes reviewed. Weight trend reviewed. General Appearance:  Well developed, well nourished, well-groomed, healthy-appearing female with Body mass index is 29.09 kg/m. No acute distress appreciable.   Skin: Clear and well-hydrated. Pulmonary:  Normal work of breathing at rest, no respiratory distress apparent.    Musculoskeletal: She demonstrates smooth and coordinated movements throughout all major joints.All extremities are intact.  Neurological:  Awake, alert, oriented, and engaged.  No obvious focal neurological deficits or cognitive impairments.  Sensorium seems unclouded.  Psychiatric:  Appropriate mood, pleasant and cooperative demeanor, cheerful and engaged during the exam  Reviewed Results & Data Results     Results for orders placed or performed in visit on 08/24/24  Iron, TIBC and Ferritin Panel  Result Value Ref Range   Iron 22 (L) 40 - 190 mcg/dL   TIBC 548 (H) 749 - 549 mcg/dL (calc)   %SAT 5 (L) 16 - 45 % (calc)   Ferritin 4 (L) 16 - 154 ng/mL  Lipid panel  Result Value Ref Range   Cholesterol 155 28 - 200 mg/dL   Triglycerides 26.9 89.9 - 149.0 mg/dL   HDL 43.69 >60.99  mg/dL   VLDL 85.3 0.0 - 59.9 mg/dL   LDL Cholesterol 84 10 - 99 mg/dL   Total CHOL/HDL Ratio 3    NonHDL 98.90   Comprehensive metabolic panel with GFR  Result Value Ref Range   Sodium 138 135 - 145 mEq/L   Potassium 3.7 3.5 - 5.1 mEq/L   Chloride 107 96 - 112 mEq/L   CO2 23 19 - 32 mEq/L   Glucose, Bld 80 70 - 99 mg/dL   BUN 8 6 - 23 mg/dL   Creatinine, Ser 9.30 0.40 - 1.20 mg/dL   Total Bilirubin 0.5 0.2 - 1.2 mg/dL   Alkaline Phosphatase 50 39 - 117 U/L   AST 16 5 - 37 U/L   ALT 14 3 - 35 U/L   Total Protein 8.0 6.0 - 8.3 g/dL   Albumin 4.5 3.5 - 5.2 g/dL   GFR 887.07 >39.99 mL/min   Calcium 9.5 8.4 - 10.5 mg/dL  CBC with Differential/Platelet  Result Value Ref Range   WBC 4.5 4.0 - 10.5 K/uL   RBC 4.28 3.87 - 5.11 Mil/uL   Hemoglobin 10.4 (L) 12.0 - 15.0 g/dL   HCT 67.2 (L) 63.9 - 53.9 %   MCV 76.3 (L) 78.0 - 100.0 fl   MCHC 31.9 30.0 - 36.0 g/dL   RDW 82.1 (H) 88.4 - 84.4 %   Platelets 241.0 150.0 - 400.0 K/uL   Neutrophils Relative % 59.0 43.0 - 77.0 %   Lymphocytes Relative 33.4 12.0 - 46.0 %   Monocytes Relative 6.7 3.0 - 12.0 %   Eosinophils Relative 0.6 0.0 - 5.0 %   Basophils Relative 0.3 0.0 - 3.0 %   Neutro Abs 2.7 1.4 - 7.7 K/uL   Lymphs Abs 1.5 0.7 - 4.0 K/uL   Monocytes Absolute 0.3 0.1 - 1.0 K/uL   Eosinophils Absolute 0.0 0.0 - 0.7 K/uL   Basophils Absolute 0.0 0.0 - 0.1 K/uL  TSH+Prl+FSH+TestT+LH+DHEA S...  Result Value Ref Range   TSH 1.320 0.450 - 4.500 uIU/mL   Testosterone 13 8 - 60 ng/dL   Testosterone, Free WILL FOLLOW    LH 7.5 mIU/mL   FSH 2.9 mIU/mL   Prolactin 14.4 4.8 - 33.4 ng/mL   17-Hydroxyprogesterone WILL FOLLOW    DHEA-SO4 138.0 84.8 - 378.0 ug/dL   Androstenedione WILL FOLLOW   C-reactive protein  Result Value Ref Range   CRP <0.5 (L) 1.0 - 20.0 mg/dL  Sedimentation rate  Result Value Ref Range   Sed Rate 28 (H) 0 - 20 mm/h  Reticulocytes  Result Value Ref Range   Retic Ct Pct 1.5 %   ABS Retic 64,350 20,000 - 80,000  cells/uL    Office Visit on 08/24/2024  Component Date Value   Iron 08/24/2024 22 (L)    TIBC 08/24/2024 451 (H)    %SAT 08/24/2024 5 (L)    Ferritin 08/24/2024 4 (L)    Cholesterol 08/24/2024 155    Triglycerides 08/24/2024 73.0    HDL 08/24/2024 56.30    VLDL 08/24/2024 14.6    LDL Cholesterol 08/24/2024 84    Total CHOL/HDL Ratio 08/24/2024 3    NonHDL 08/24/2024 98.90    Sodium 08/24/2024 138    Potassium 08/24/2024 3.7    Chloride 08/24/2024 107    CO2 08/24/2024 23    Glucose, Bld 08/24/2024 80    BUN 08/24/2024 8    Creatinine, Ser 08/24/2024 0.69    Total Bilirubin 08/24/2024 0.5    Alkaline Phosphatase 08/24/2024 50    AST 08/24/2024 16    ALT 08/24/2024 14    Total Protein 08/24/2024 8.0    Albumin 08/24/2024 4.5    GFR 08/24/2024 112.92    Calcium 08/24/2024 9.5    WBC 08/24/2024 4.5    RBC 08/24/2024 4.28    Hemoglobin 08/24/2024 10.4 (L)    HCT 08/24/2024 32.7 (L)    MCV 08/24/2024 76.3 (L)    MCHC 08/24/2024 31.9    RDW 08/24/2024 17.8 (H)    Platelets 08/24/2024 241.0    Neutrophils Relative % 08/24/2024 59.0    Lymphocytes Relative 08/24/2024 33.4    Monocytes Relative 08/24/2024 6.7    Eosinophils Relative 08/24/2024 0.6    Basophils Relative 08/24/2024 0.3    Neutro Abs 08/24/2024 2.7    Lymphs Abs 08/24/2024 1.5    Monocytes Absolute 08/24/2024 0.3    Eosinophils Absolute 08/24/2024 0.0    Basophils Absolute 08/24/2024 0.0    TSH 08/24/2024 1.320    Testosterone 08/24/2024 13    Testosterone, Free 08/24/2024 WILL FOLLOW    LH 08/24/2024 7.5    FSH 08/24/2024 2.9    Prolactin 08/24/2024 14.4    17-Hydroxyprogesterone 08/24/2024 WILL FOLLOW    DHEA-SO4 08/24/2024 138.0    Androstenedione 08/24/2024 WILL FOLLOW    CRP 08/24/2024 <0.5 (L)    Sed Rate 08/24/2024 28 (H)    Retic Ct Pct 08/24/2024 1.5    ABS Retic 08/24/2024 64,350   Hospital Outpatient Visit on 06/20/2024  Component Date Value   Preg Test, Ur 06/20/2024 NEGATIVE   Admission  on 06/05/2024, Discharged on 06/05/2024  Component Date Value   Prothrombin Time 06/05/2024 13.9    INR 06/05/2024 1.0    aPTT 06/05/2024 30    WBC 06/05/2024 5.6    RBC 06/05/2024 4.38    Hemoglobin 06/05/2024 10.0 (L)    HCT 06/05/2024 33.8 (L)    MCV 06/05/2024 77.2 (L)    MCH 06/05/2024 22.8 (L)    MCHC 06/05/2024 29.6 (L)    RDW 06/05/2024 16.7 (H)    Platelets 06/05/2024 327    nRBC 06/05/2024 0.0    Neutrophils Relative % 06/05/2024 60    Neutro Abs 06/05/2024 3.4    Lymphocytes Relative 06/05/2024 33    Lymphs Abs 06/05/2024 1.8    Monocytes Relative 06/05/2024 6    Monocytes Absolute 06/05/2024 0.3    Eosinophils Relative 06/05/2024 1    Eosinophils Absolute 06/05/2024 0.0    Basophils Relative 06/05/2024 0    Basophils Absolute 06/05/2024 0.0    Immature Granulocytes 06/05/2024 0    Abs Immature Granulocytes 06/05/2024 0.01    Sodium 06/05/2024 138    Potassium 06/05/2024 3.5    Chloride 06/05/2024 105  CO2 06/05/2024 24    Glucose, Bld 06/05/2024 81    BUN 06/05/2024 6    Creatinine, Ser 06/05/2024 0.68    Calcium 06/05/2024 9.2    Total Protein 06/05/2024 7.7    Albumin 06/05/2024 3.8    AST 06/05/2024 18    ALT 06/05/2024 13    Alkaline Phosphatase 06/05/2024 49    Total Bilirubin 06/05/2024 0.4    GFR, Estimated 06/05/2024 >60    Anion gap 06/05/2024 9    Alcohol, Ethyl (B) 06/05/2024 <15    Glucose-Capillary 06/05/2024 57 (L)    Preg, Serum 06/05/2024 NEGATIVE    No image results found.   IR ANGIO INTRA EXTRACRAN SEL COM CAROTID INNOMINATE BILAT MOD SED Result Date: 06/28/2024 INDICATION: Carotid aneurysm EXAM: Cerebral diagnostic angiography COMPARISON:  None Available. MEDICATIONS: Heparin . ANESTHESIA/SEDATION: Moderate (conscious) sedation was employed during this procedure. A total of Versed  1 mg and Fentanyl  25 mcg was administered intravenously by the radiology nurse. Total intra-service moderate Sedation Time: 16 minutes. The patient's level  of consciousness and vital signs were monitored continuously by radiology nursing throughout the procedure under my direct supervision. CONTRAST:  58 mL of Omnipaque  300 FLUOROSCOPY: Radiation Exposure Index (as provided by the fluoroscopic device): See chart mGy Kerma COMPLICATIONS: None immediate. TECHNIQUE: Transradial Diagnostic Cerebral Angiography PREOPERATIVE DIAGNOSIS: Right carotid aneurysm POSTOPERATIVE DIAGNOSIS: Same PROCEDURE PERFORMED: 1: Ultrasound-guided right Radial artery access 2: Multivessel diagnostic angiography 3: Maximum intensity projections/Volume surface rendering/3D imaging and interpretation 4: Closure of the right radial artery access with closure device: TR Band DEVICES USED: 1: 5 French sheath 2: 5 French Simmons 2 catheter 3: 0.038 inch Glidewire 4: Closure with: TR Band VESSELS CATHETERIZED: 1: Right radial artery 2: Aortic Arch 3: Right common carotid artery 4: Right vertebral artery 5: Left common carotid artery 6: Left vertebral artery 7: Right brachial artery 8: Right subclavian artery 9: Left subclavian artery PREOPERATIVE COURSE: Patient is a 34 years year old lady with a history of a right carotid aneurysm was seen as an outpatient in our clinic. Patient's films were reviewed with the patient and the family and options were discussed with them including but not limited to noninvasive imaging, observation, angiography amongst others. Benefits and risks of each option were discussed with them in layman's terms with the risks of the angiogram including but not limited to infection, hemorrhage, stroke, paralysis, blindness, speech impairment, renal failure, DVT, PE, cardio pulmonary complications and death amongst others. All the patient's questions were answered to their satisfaction as voiced by them and they requested for us  to proceed. DESCRIPTION OF PROCEDURE: Patient was brought to the angio suite and after patient was positioned and all pressure points padded, patient was  prepped and draped in usual sterile fashion. After injection of local anesthetic, ultrasound guidance was used to evaluate the right wrist site; patency of the right radial artery was noted and documented in PACS. Using a standard micropuncture kit with ultrasound guidance realtime visualization, the micropuncture needle was advanced into the right radial artery and intravascular location of the needle tip was confirmed on ultrasound. Thereafter using a modified Seldinger technique a 5 French sheath was inserted. The diagnostic catheter was navigated over the aortic arch and vessels were selectively catheterized over a 0.038 inch Glidewire. After the images were obtained they were reviewed and found to be of diagnostic quality. The diagnostic catheter and wire were then removed and the sheath discontinued with closure of the access site with TR Band At the end of  the procedure patient's pulses were present. I was present for the entire procedure. Right common carotid artery cervical view, AP and lateral views demonstrates the common carotid artery which courses superiorly and bifurcates into the external the internal carotid arteries there is minimal disease burden at the origin of the right internal carotid artery as it courses superiorly into the skull base. Right common carotid artery cranial view, AP and lateral view demonstrates the internal carotid artery coursing into the skull base through the petrous, lacerum and cavernous segments and then in the post clinoid segment continuing superiorly and bifurcating into the ACA as well as the MCA. The middle cerebral artery bifurcation then continues laterally into the convexity branches. The anterior cerebral artery continues medially into the anterior cerebral artery circulation. There is no aneurysm appreciated at the right MCA bifurcation. There is an aneurysm at the base of the ophthalmic segment pointing medially. Left common carotid artery cervical view, AP  and lateral views demonstrates the common carotid artery which courses superiorly and bifurcates into the external the internal carotid arteries there is minimal disease burden at the origin of the right internal carotid artery as it courses superiorly into the skull base. Left common carotid artery injection cranial AP and lateral views demonstrates internal carotid artery coursing through the skull base through the petrous, lacerum and cavernous segments and then in the posterior clinoid segment continuing superiorly and bifurcating into the ACA as well as the MCA. The middle cerebral artery bifurcation and continues laterally into the convexity branches. The anterior cerebral artery continues medially into the anterior cerebral artery circulation. Left subclavian artery injection demonstrates the origin of the left vertebral artery without any significant stenosis. The artery then continues superiorly in a tortuous fashion. Left vertebral artery injection cranial AP and lateral view demonstrates the left V2, V3 and V4 segments as it continues into the basilar artery which continues superiorly into the posterior cerebral circulation. Right vertebral artery injection cranial AP and lateral view demonstrates the right V2, V3 and V4 segments as it continues into the basilar artery which continues superiorly into the posterior cerebral circulation. Maximum intensity projection/volume surface rendering imaging demonstrates superior hypophyseal artery aneurysm measuring 3.3 x 2.4 mm. 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 Electronically Signed   By: Dino Sable   On: 06/28/2024 10:36   IR US  Guide Vasc Access Right Result Date: 06/28/2024 INDICATION: Carotid aneurysm EXAM: Cerebral diagnostic angiography COMPARISON:  None Available. MEDICATIONS: Heparin . ANESTHESIA/SEDATION: Moderate (conscious) sedation was employed during  this procedure. A total of Versed  1 mg and Fentanyl  25 mcg was administered intravenously by the radiology nurse. Total intra-service moderate Sedation Time: 16 minutes. The patient's level of consciousness and vital signs were monitored continuously by radiology nursing throughout the procedure under my direct supervision. CONTRAST:  58 mL of Omnipaque  300 FLUOROSCOPY: Radiation Exposure Index (as provided by the fluoroscopic device): See chart mGy Kerma COMPLICATIONS: None immediate. TECHNIQUE: Transradial Diagnostic Cerebral Angiography PREOPERATIVE DIAGNOSIS: Right carotid aneurysm POSTOPERATIVE DIAGNOSIS: Same PROCEDURE PERFORMED: 1: Ultrasound-guided right Radial artery access 2: Multivessel diagnostic angiography 3: Maximum intensity projections/Volume surface rendering/3D imaging and interpretation 4: Closure of the right radial artery access with closure device: TR Band DEVICES USED: 1: 5 French sheath 2: 5 French Simmons 2 catheter 3: 0.038 inch Glidewire 4: Closure with: TR Band VESSELS CATHETERIZED: 1: Right radial artery 2: Aortic Arch 3: Right common carotid artery 4: Right vertebral artery 5:  Left common carotid artery 6: Left vertebral artery 7: Right brachial artery 8: Right subclavian artery 9: Left subclavian artery PREOPERATIVE COURSE: Patient is a 49 years year old lady with a history of a right carotid aneurysm was seen as an outpatient in our clinic. Patient's films were reviewed with the patient and the family and options were discussed with them including but not limited to noninvasive imaging, observation, angiography amongst others. Benefits and risks of each option were discussed with them in layman's terms with the risks of the angiogram including but not limited to infection, hemorrhage, stroke, paralysis, blindness, speech impairment, renal failure, DVT, PE, cardio pulmonary complications and death amongst others. All the patient's questions were answered to their satisfaction as  voiced by them and they requested for us  to proceed. DESCRIPTION OF PROCEDURE: Patient was brought to the angio suite and after patient was positioned and all pressure points padded, patient was prepped and draped in usual sterile fashion. After injection of local anesthetic, ultrasound guidance was used to evaluate the right wrist site; patency of the right radial artery was noted and documented in PACS. Using a standard micropuncture kit with ultrasound guidance realtime visualization, the micropuncture needle was advanced into the right radial artery and intravascular location of the needle tip was confirmed on ultrasound. Thereafter using a modified Seldinger technique a 5 French sheath was inserted. The diagnostic catheter was navigated over the aortic arch and vessels were selectively catheterized over a 0.038 inch Glidewire. After the images were obtained they were reviewed and found to be of diagnostic quality. The diagnostic catheter and wire were then removed and the sheath discontinued with closure of the access site with TR Band At the end of the procedure patient's pulses were present. I was present for the entire procedure. Right common carotid artery cervical view, AP and lateral views demonstrates the common carotid artery which courses superiorly and bifurcates into the external the internal carotid arteries there is minimal disease burden at the origin of the right internal carotid artery as it courses superiorly into the skull base. Right common carotid artery cranial view, AP and lateral view demonstrates the internal carotid artery coursing into the skull base through the petrous, lacerum and cavernous segments and then in the post clinoid segment continuing superiorly and bifurcating into the ACA as well as the MCA. The middle cerebral artery bifurcation then continues laterally into the convexity branches. The anterior cerebral artery continues medially into the anterior cerebral artery  circulation. There is no aneurysm appreciated at the right MCA bifurcation. There is an aneurysm at the base of the ophthalmic segment pointing medially. Left common carotid artery cervical view, AP and lateral views demonstrates the common carotid artery which courses superiorly and bifurcates into the external the internal carotid arteries there is minimal disease burden at the origin of the right internal carotid artery as it courses superiorly into the skull base. Left common carotid artery injection cranial AP and lateral views demonstrates internal carotid artery coursing through the skull base through the petrous, lacerum and cavernous segments and then in the posterior clinoid segment continuing superiorly and bifurcating into the ACA as well as the MCA. The middle cerebral artery bifurcation and continues laterally into the convexity branches. The anterior cerebral artery continues medially into the anterior cerebral artery circulation. Left subclavian artery injection demonstrates the origin of the left vertebral artery without any significant stenosis. The artery then continues superiorly in a tortuous fashion. Left vertebral artery injection cranial AP and  lateral view demonstrates the left V2, V3 and V4 segments as it continues into the basilar artery which continues superiorly into the posterior cerebral circulation. Right vertebral artery injection cranial AP and lateral view demonstrates the right V2, V3 and V4 segments as it continues into the basilar artery which continues superiorly into the posterior cerebral circulation. Maximum intensity projection/volume surface rendering imaging demonstrates superior hypophyseal artery aneurysm measuring 3.3 x 2.4 mm. 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 Electronically Signed   By: Rashid  Janjua   On: 06/28/2024 10:36   IR 3D Independent [place] Result Date:  06/28/2024 INDICATION: Carotid aneurysm EXAM: Cerebral diagnostic angiography COMPARISON:  None Available. MEDICATIONS: Heparin . ANESTHESIA/SEDATION: Moderate (conscious) sedation was employed during this procedure. A total of Versed  1 mg and Fentanyl  25 mcg was administered intravenously by the radiology nurse. Total intra-service moderate Sedation Time: 16 minutes. The patient's level of consciousness and vital signs were monitored continuously by radiology nursing throughout the procedure under my direct supervision. CONTRAST:  58 mL of Omnipaque  300 FLUOROSCOPY: Radiation Exposure Index (as provided by the fluoroscopic device): See chart mGy Kerma COMPLICATIONS: None immediate. TECHNIQUE: Transradial Diagnostic Cerebral Angiography PREOPERATIVE DIAGNOSIS: Right carotid aneurysm POSTOPERATIVE DIAGNOSIS: Same PROCEDURE PERFORMED: 1: Ultrasound-guided right Radial artery access 2: Multivessel diagnostic angiography 3: Maximum intensity projections/Volume surface rendering/3D imaging and interpretation 4: Closure of the right radial artery access with closure device: TR Band DEVICES USED: 1: 5 French sheath 2: 5 French Simmons 2 catheter 3: 0.038 inch Glidewire 4: Closure with: TR Band VESSELS CATHETERIZED: 1: Right radial artery 2: Aortic Arch 3: Right common carotid artery 4: Right vertebral artery 5: Left common carotid artery 6: Left vertebral artery 7: Right brachial artery 8: Right subclavian artery 9: Left subclavian artery PREOPERATIVE COURSE: Patient is a 66 years year old lady with a history of a right carotid aneurysm was seen as an outpatient in our clinic. Patient's films were reviewed with the patient and the family and options were discussed with them including but not limited to noninvasive imaging, observation, angiography amongst others. Benefits and risks of each option were discussed with them in layman's terms with the risks of the angiogram including but not limited to infection, hemorrhage,  stroke, paralysis, blindness, speech impairment, renal failure, DVT, PE, cardio pulmonary complications and death amongst others. All the patient's questions were answered to their satisfaction as voiced by them and they requested for us  to proceed. DESCRIPTION OF PROCEDURE: Patient was brought to the angio suite and after patient was positioned and all pressure points padded, patient was prepped and draped in usual sterile fashion. After injection of local anesthetic, ultrasound guidance was used to evaluate the right wrist site; patency of the right radial artery was noted and documented in PACS. Using a standard micropuncture kit with ultrasound guidance realtime visualization, the micropuncture needle was advanced into the right radial artery and intravascular location of the needle tip was confirmed on ultrasound. Thereafter using a modified Seldinger technique a 5 French sheath was inserted. The diagnostic catheter was navigated over the aortic arch and vessels were selectively catheterized over a 0.038 inch Glidewire. After the images were obtained they were reviewed and found to be of diagnostic quality. The diagnostic catheter and wire were then removed and the sheath discontinued with closure of the access site with TR Band At the end of the procedure patient's pulses were present. I was present for the entire procedure. Right  common carotid artery cervical view, AP and lateral views demonstrates the common carotid artery which courses superiorly and bifurcates into the external the internal carotid arteries there is minimal disease burden at the origin of the right internal carotid artery as it courses superiorly into the skull base. Right common carotid artery cranial view, AP and lateral view demonstrates the internal carotid artery coursing into the skull base through the petrous, lacerum and cavernous segments and then in the post clinoid segment continuing superiorly and bifurcating into the ACA as  well as the MCA. The middle cerebral artery bifurcation then continues laterally into the convexity branches. The anterior cerebral artery continues medially into the anterior cerebral artery circulation. There is no aneurysm appreciated at the right MCA bifurcation. There is an aneurysm at the base of the ophthalmic segment pointing medially. Left common carotid artery cervical view, AP and lateral views demonstrates the common carotid artery which courses superiorly and bifurcates into the external the internal carotid arteries there is minimal disease burden at the origin of the right internal carotid artery as it courses superiorly into the skull base. Left common carotid artery injection cranial AP and lateral views demonstrates internal carotid artery coursing through the skull base through the petrous, lacerum and cavernous segments and then in the posterior clinoid segment continuing superiorly and bifurcating into the ACA as well as the MCA. The middle cerebral artery bifurcation and continues laterally into the convexity branches. The anterior cerebral artery continues medially into the anterior cerebral artery circulation. Left subclavian artery injection demonstrates the origin of the left vertebral artery without any significant stenosis. The artery then continues superiorly in a tortuous fashion. Left vertebral artery injection cranial AP and lateral view demonstrates the left V2, V3 and V4 segments as it continues into the basilar artery which continues superiorly into the posterior cerebral circulation. Right vertebral artery injection cranial AP and lateral view demonstrates the right V2, V3 and V4 segments as it continues into the basilar artery which continues superiorly into the posterior cerebral circulation. Maximum intensity projection/volume surface rendering imaging demonstrates superior hypophyseal artery aneurysm measuring 3.3 x 2.4 mm. IMPRESSION: Right superior hypophyseal artery  aneurysm measuring 3.3 x 2.4 mm PLAN: Patient transferred to the recovery room. Electronically Signed   By: Rashid  Janjua   On: 06/28/2024 10:36   IR ANGIO VERTEBRAL SEL VERTEBRAL UNI L MOD SED Result Date: 06/28/2024 INDICATION: Carotid aneurysm EXAM: Cerebral diagnostic angiography COMPARISON:  None Available. MEDICATIONS: Heparin . ANESTHESIA/SEDATION: Moderate (conscious) sedation was employed during this procedure. A total of Versed  1 mg and Fentanyl  25 mcg was administered intravenously by the radiology nurse. Total intra-service moderate Sedation Time: 16 minutes. The patient's level of consciousness and vital signs were monitored continuously by radiology nursing throughout the procedure under my direct supervision. CONTRAST:  58 mL of Omnipaque  300 FLUOROSCOPY: Radiation Exposure Index (as provided by the fluoroscopic device): See chart mGy Kerma COMPLICATIONS: None immediate. TECHNIQUE: Transradial Diagnostic Cerebral Angiography PREOPERATIVE DIAGNOSIS: Right carotid aneurysm POSTOPERATIVE DIAGNOSIS: Same PROCEDURE PERFORMED: 1: Ultrasound-guided right Radial artery access 2: Multivessel diagnostic angiography 3: Maximum intensity projections/Volume surface rendering/3D imaging and interpretation 4: Closure of the right radial artery access with closure device: TR Band DEVICES USED: 1: 5 French sheath 2: 5 French Simmons 2 catheter 3: 0.038 inch Glidewire 4: Closure with: TR Band VESSELS CATHETERIZED: 1: Right radial artery 2: Aortic Arch 3: Right common carotid artery 4: Right vertebral artery 5: Left common carotid artery 6: Left vertebral artery 7: Right brachial  artery 8: Right subclavian artery 9: Left subclavian artery PREOPERATIVE COURSE: Patient is a 37 years year old lady with a history of a right carotid aneurysm was seen as an outpatient in our clinic. Patient's films were reviewed with the patient and the family and options were discussed with them including but not limited to noninvasive  imaging, observation, angiography amongst others. Benefits and risks of each option were discussed with them in layman's terms with the risks of the angiogram including but not limited to infection, hemorrhage, stroke, paralysis, blindness, speech impairment, renal failure, DVT, PE, cardio pulmonary complications and death amongst others. All the patient's questions were answered to their satisfaction as voiced by them and they requested for us  to proceed. DESCRIPTION OF PROCEDURE: Patient was brought to the angio suite and after patient was positioned and all pressure points padded, patient was prepped and draped in usual sterile fashion. After injection of local anesthetic, ultrasound guidance was used to evaluate the right wrist site; patency of the right radial artery was noted and documented in PACS. Using a standard micropuncture kit with ultrasound guidance realtime visualization, the micropuncture needle was advanced into the right radial artery and intravascular location of the needle tip was confirmed on ultrasound. Thereafter using a modified Seldinger technique a 5 French sheath was inserted. The diagnostic catheter was navigated over the aortic arch and vessels were selectively catheterized over a 0.038 inch Glidewire. After the images were obtained they were reviewed and found to be of diagnostic quality. The diagnostic catheter and wire were then removed and the sheath discontinued with closure of the access site with TR Band At the end of the procedure patient's pulses were present. I was present for the entire procedure. Right common carotid artery cervical view, AP and lateral views demonstrates the common carotid artery which courses superiorly and bifurcates into the external the internal carotid arteries there is minimal disease burden at the origin of the right internal carotid artery as it courses superiorly into the skull base. Right common carotid artery cranial view, AP and lateral view  demonstrates the internal carotid artery coursing into the skull base through the petrous, lacerum and cavernous segments and then in the post clinoid segment continuing superiorly and bifurcating into the ACA as well as the MCA. The middle cerebral artery bifurcation then continues laterally into the convexity branches. The anterior cerebral artery continues medially into the anterior cerebral artery circulation. There is no aneurysm appreciated at the right MCA bifurcation. There is an aneurysm at the base of the ophthalmic segment pointing medially. Left common carotid artery cervical view, AP and lateral views demonstrates the common carotid artery which courses superiorly and bifurcates into the external the internal carotid arteries there is minimal disease burden at the origin of the right internal carotid artery as it courses superiorly into the skull base. Left common carotid artery injection cranial AP and lateral views demonstrates internal carotid artery coursing through the skull base through the petrous, lacerum and cavernous segments and then in the posterior clinoid segment continuing superiorly and bifurcating into the ACA as well as the MCA. The middle cerebral artery bifurcation and continues laterally into the convexity branches. The anterior cerebral artery continues medially into the anterior cerebral artery circulation. Left subclavian artery injection demonstrates the origin of the left vertebral artery without any significant stenosis. The artery then continues superiorly in a tortuous fashion. Left vertebral artery injection cranial AP and lateral view demonstrates the left V2, V3 and V4 segments as  it continues into the basilar artery which continues superiorly into the posterior cerebral circulation. Right vertebral artery injection cranial AP and lateral view demonstrates the right V2, V3 and V4 segments as it continues into the basilar artery which continues superiorly into the  posterior cerebral circulation. Maximum intensity projection/volume surface rendering imaging demonstrates superior hypophyseal artery aneurysm measuring 3.3 x 2.4 mm. 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 Electronically Signed   By: Rashid  Janjua   On: 06/28/2024 10:36   IR ANGIO VERTEBRAL SEL SUBCLAVIAN INNOMINATE UNI R MOD SED Result Date: 06/28/2024 INDICATION: Carotid aneurysm EXAM: Cerebral diagnostic angiography COMPARISON:  None Available. MEDICATIONS: Heparin . ANESTHESIA/SEDATION: Moderate (conscious) sedation was employed during this procedure. A total of Versed  1 mg and Fentanyl  25 mcg was administered intravenously by the radiology nurse. Total intra-service moderate Sedation Time: 16 minutes. The patient's level of consciousness and vital signs were monitored continuously by radiology nursing throughout the procedure under my direct supervision. CONTRAST:  58 mL of Omnipaque  300 FLUOROSCOPY: Radiation Exposure Index (as provided by the fluoroscopic device): See chart mGy Kerma COMPLICATIONS: None immediate. TECHNIQUE: Transradial Diagnostic Cerebral Angiography PREOPERATIVE DIAGNOSIS: Right carotid aneurysm POSTOPERATIVE DIAGNOSIS: Same PROCEDURE PERFORMED: 1: Ultrasound-guided right Radial artery access 2: Multivessel diagnostic angiography 3: Maximum intensity projections/Volume surface rendering/3D imaging and interpretation 4: Closure of the right radial artery access with closure device: TR Band DEVICES USED: 1: 5 French sheath 2: 5 French Simmons 2 catheter 3: 0.038 inch Glidewire 4: Closure with: TR Band VESSELS CATHETERIZED: 1: Right radial artery 2: Aortic Arch 3: Right common carotid artery 4: Right vertebral artery 5: Left common carotid artery 6: Left vertebral artery 7: Right brachial artery 8: Right subclavian artery 9: Left subclavian artery PREOPERATIVE COURSE: Patient is a 59 years year old lady  with a history of a right carotid aneurysm was seen as an outpatient in our clinic. Patient's films were reviewed with the patient and the family and options were discussed with them including but not limited to noninvasive imaging, observation, angiography amongst others. Benefits and risks of each option were discussed with them in layman's terms with the risks of the angiogram including but not limited to infection, hemorrhage, stroke, paralysis, blindness, speech impairment, renal failure, DVT, PE, cardio pulmonary complications and death amongst others. All the patient's questions were answered to their satisfaction as voiced by them and they requested for us  to proceed. DESCRIPTION OF PROCEDURE: Patient was brought to the angio suite and after patient was positioned and all pressure points padded, patient was prepped and draped in usual sterile fashion. After injection of local anesthetic, ultrasound guidance was used to evaluate the right wrist site; patency of the right radial artery was noted and documented in PACS. Using a standard micropuncture kit with ultrasound guidance realtime visualization, the micropuncture needle was advanced into the right radial artery and intravascular location of the needle tip was confirmed on ultrasound. Thereafter using a modified Seldinger technique a 5 French sheath was inserted. The diagnostic catheter was navigated over the aortic arch and vessels were selectively catheterized over a 0.038 inch Glidewire. After the images were obtained they were reviewed and found to be of diagnostic quality. The diagnostic catheter and wire were then removed and the sheath discontinued with closure of the access site with TR Band At the end of the procedure patient's pulses were present. I was present for the entire procedure. Right common carotid artery cervical view,  AP and lateral views demonstrates the common carotid artery which courses superiorly and bifurcates into the external  the internal carotid arteries there is minimal disease burden at the origin of the right internal carotid artery as it courses superiorly into the skull base. Right common carotid artery cranial view, AP and lateral view demonstrates the internal carotid artery coursing into the skull base through the petrous, lacerum and cavernous segments and then in the post clinoid segment continuing superiorly and bifurcating into the ACA as well as the MCA. The middle cerebral artery bifurcation then continues laterally into the convexity branches. The anterior cerebral artery continues medially into the anterior cerebral artery circulation. There is no aneurysm appreciated at the right MCA bifurcation. There is an aneurysm at the base of the ophthalmic segment pointing medially. Left common carotid artery cervical view, AP and lateral views demonstrates the common carotid artery which courses superiorly and bifurcates into the external the internal carotid arteries there is minimal disease burden at the origin of the right internal carotid artery as it courses superiorly into the skull base. Left common carotid artery injection cranial AP and lateral views demonstrates internal carotid artery coursing through the skull base through the petrous, lacerum and cavernous segments and then in the posterior clinoid segment continuing superiorly and bifurcating into the ACA as well as the MCA. The middle cerebral artery bifurcation and continues laterally into the convexity branches. The anterior cerebral artery continues medially into the anterior cerebral artery circulation. Left subclavian artery injection demonstrates the origin of the left vertebral artery without any significant stenosis. The artery then continues superiorly in a tortuous fashion. Left vertebral artery injection cranial AP and lateral view demonstrates the left V2, V3 and V4 segments as it continues into the basilar artery which continues superiorly into the  posterior cerebral circulation. Right vertebral artery injection cranial AP and lateral view demonstrates the right V2, V3 and V4 segments as it continues into the basilar artery which continues superiorly into the posterior cerebral circulation. Maximum intensity projection/volume surface rendering imaging demonstrates superior hypophyseal artery aneurysm measuring 3.3 x 2.4 mm. 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 Electronically Signed   By: Rashid  Janjua   On: 06/28/2024 10:36   MR ANGIO HEAD WO CONTRAST Result Date: 06/05/2024 EXAM: MR Angiography Head with Intravenous Contrast. CLINICAL HISTORY: Headache, neuro deficit. Pt sent from urgent care; pt states she was diagnosed with ischemic stroke this past Saturday; states still having weakness and tingling in R arm; sent to ED for MRI. 8mL gadavist . TECHNIQUE: Magnetic resonance angiography images of the head with intravenous contrast. Three-dimensional MIP reformations performed. CONTRAST: With 8 mL gadobutrol  (GADAVIST ) 1 MMOL/ML injection. COMPARISON: Concomitant MRI of the brain performed at the same time. FINDINGS: INTERNAL CAROTID ARTERIES: 4 mm outpouching extending medially from the cavernous right ICA, consistent with a small paraophthalmic aneurysm (series 1, image 114). No significant stenosis. No AVM. ANTERIOR CEREBRAL ARTERIES: No significant stenosis. No aneurysm or AVM. MIDDLE CEREBRAL ARTERIES: No significant stenosis. No aneurysm or AVM. POSTERIOR CEREBRAL ARTERIES: No significant stenosis. No aneurysm or AVM. BASILAR ARTERY: No significant stenosis. No aneurysm or AVM. VERTEBRAL ARTERIES: No significant stenosis. No aneurysm or AVM. IMPRESSION: 1. 4 mm right paraophthalmic aneurysm. 2. Otherwise unremarkable and normal intracranial MRI. Electronically signed by: Morene Hoard MD 06/05/2024 10:51 PM EDT RP Workstation: [id number]   MR Brain W and  Wo Contrast Result Date: 06/05/2024 EXAM: MRI BRAIN  WITH AND WITHOUT CONTRAST 06/05/2024 09:59:03 PM TECHNIQUE: Multiplanar multisequence MRI of the head/brain was performed with and without the administration of 8 mL gadobutrol  (GADAVIST ) 1 MMOL/ML injection. COMPARISON: Comparison made with prior CT from earlier the same day as well as previous MRI from 02/13/2021. CLINICAL HISTORY: Headache, neuro deficit. Table formatting from the original note was not included.; Pt sent from urgent care; pt states she was diagnosed with ischemic stroke this past Saturday; states still having weakness and tingling inn R arm; sent to ED for MRI; ; Headache, neuro deficit 8ml gadavist  FINDINGS: BRAIN AND VENTRICLES: No acute infarct. No acute intracranial hemorrhage. No mass effect or midline shift. No hydrocephalus. The sella is unremarkable. Normal flow voids. No mass or abnormal enhancement. ORBITS: No acute abnormality. SINUSES: No acute abnormality. BONES AND SOFT TISSUES: Decreased T1 signal intensity within the visualized bone marrow, nonspecific, but most commonly related to anemia, smoking, or obesity. No acute soft tissue abnormality. IMPRESSION: 1. Normal brain MRI.  No acute intracranial abnormality. Electronically signed by: Morene Hoard MD 06/05/2024 10:46 PM EDT RP Workstation: [id number]   CT HEAD WO CONTRAST Result Date: 06/05/2024 EXAM: CT HEAD WITHOUT CONTRAST 06/05/2024 05:59:00 PM TECHNIQUE: CT of the head was performed without the administration of intravenous contrast. Automated exposure control, iterative reconstruction, and/or weight based adjustment of the mA/kV was utilized to reduce the radiation dose to as low as reasonably achievable. COMPARISON: Head CT 02/27/2021, MRI 02/13/2021 CLINICAL HISTORY: Neuro deficit, acute, stroke suspected. R sided tingling; CT HEAD WO CONTRAST; Neuro deficit, acute, stroke suspected; See ED Notes:; Pt sent from urgent care; pt states she was diagnosed with  ischemic stroke this past Saturday; states still having weakness and tingling inn R arm; sent to ED for MRI; denies new symptoms. FINDINGS: BRAIN AND VENTRICLES: No acute hemorrhage. No evidence of acute infarct. No hydrocephalus. No extra-axial collection. No mass effect or midline shift. ORBITS: No acute abnormality. SINUSES: No acute abnormality. SOFT TISSUES AND SKULL: No acute soft tissue abnormality. No skull fracture. IMPRESSION: 1. No acute intracranial abnormality. Electronically signed by: Luke Bun MD 06/05/2024 06:21 PM EDT RP Workstation: [id number]     ASSESSMENT & PLAN   Assessment & Plan Complicated migraine History of TIA (transient ischemic attack) She experiences migraines with stroke-like symptoms, including right hand numbness and speech difficulties, which resolve spontaneously. The MRI revealed a carotid artery aneurysm, but symptoms are attributed to the migraine. Avoid Imitrex due to the aneurysm and use a nasal spray for acute migraine management. Prior Authorization Letter for Zavzpret  (Zavegepant) Nasal Spray RE: Prior Authorization Request for Zavzpret  (Zavegepant) 10 mg Nasal Spray   Date: August 24, 2024  To Whom It May Concern:  I am writing to request prior authorization for Zavzpret  (zavegepant) 10 mg nasal spray for the acute treatment of migraine in the above-referenced patient. I am requesting an exception to standard step therapy requirements based on absolute medical contraindications to first-line vasoconstrictive migraine therapies.  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 She has a documented growing carotid artery aneurysm and a history of brain aneurysm. Her migraines are associated with significant functional impairment and require rapid, effective acute treatment. The diagnostic challenge of distinguishing her migraine aura from true cerebrovascular events  necessitates particularly careful medication selection.  Medical Necessity for Zavzpret :  Zavegepant is a calcitonin gene-related peptide (CGRP) receptor antagonist that is non-vasoconstrictive and represents the only FDA-approved intranasal gepant for acute migraine treatment.[1] This  patient has absolute contraindications to standard first-line vasoconstrictive therapies:  1. Triptans are contraindicated in patients with cerebrovascular disease, including those with cerebral aneurysms. The FDA explicitly lists stroke, TIA, and cerebrovascular disease as contraindications to triptan use. Clinical practice guidelines from the American Academy of Neurology, American Headache Society, and Department of [facility] all state that triptans should not be prescribed to patients with known or suspected cerebrovascular disease because of their vasoconstrictive effects and potential to increase the risk of cerebrovascular events.[2][3][4][5]  2. Ergot derivatives (ergotamine, dihydroergotamine) are similarly contraindicated due to their potent vasoconstrictive properties and are considered even more problematic than triptans in patients with vascular disease.[6][4]  3. NSAIDs have limited efficacy as monotherapy and do not address the need for rapid onset of action in this patient whose attacks mimic TIAs and require prompt differentiation from true cerebrovascular events.  Why Step Therapy is Inappropriate:  Standard step therapy protocols typically require trial and failure of triptans before authorizing gepants. However, requiring this patient to trial triptans would be medically inappropriate and potentially dangerous given her documented cerebrovascular disease. The FDA Adverse Event Reporting System has revealed unexpected associations between triptan use and ischemic cerebrovascular events, aneurysms, and artery dissections.[4] While some authors suggest the concern may be excessive, clinical  guidelines maintain these contraindications based on the known pharmacology and vascular effects of triptans.[7][8]  Gepants, by their mechanism of action, are non-vasoconstrictive and do not carry the cardiovascular or cerebrovascular contraindications associated with triptans.[6][5][9][10] The [employer], American Headache Society, and other professional societies explicitly recommend gepants as appropriate alternatives when triptans are contraindicated or not tolerated.[10][11][12]  Why Zavzpret  Specifically:  Zavegepant nasal spray offers unique advantages for this patient:  1. Rapid onset of action: Phase 3 clinical trials demonstrate pain relief beginning at 15 minutes post-dose, with sustained efficacy up to 48 hours. This rapid onset is critical for a patient whose attacks mimic TIAs and require prompt symptom resolution to facilitate clinical assessment.[1]  2. Non-oral formulation: The intranasal route provides early peak plasma concentrations and avoids first-pass metabolism, making it particularly useful for patients with rapidly escalating headache pain or nausea. Clinical guidelines specifically recommend non-oral therapies for attacks with severe nausea, vomiting, or rapidly escalating pain.[1][13][11]  3. Proven efficacy: In the pivotal phase 3 trial ([id number]), zavegepant 10 mg nasal spray demonstrated statistically significant superiority over placebo for both coprimary endpoints: pain freedom at 2 hours (24% vs 15%, p0.0001) and freedom from most bothersome symptom at 2 hours (40% vs 31%, p=0.0012).[1][13]  4. Favorable safety profile: The most common adverse events were dysgeusia (21%), nasal discomfort (4%), and nausea (3%), all mild and self-limited. Importantly, there was no signal of hepatotoxicity or cardiovascular complications.[1][13]  5. No cardiovascular contraindications: Unlike triptans and ergots, zavegepant has no contraindications in patients  with cerebrovascular disease and has been studied safely in populations with vascular comorbidities.[9][10]  Alternative Treatments Considered:  - Oral gepants (ubrogepant, rimegepant): While these are non-vasoconstrictive alternatives, they have slower onset of action compared to the intranasal formulation and may be less effective for rapidly escalating attacks.[1]  - Lasmiditan: This 5-HT1F receptor agonist is non-vasoconstrictive but carries an 8-hour driving restriction and causes dizziness and somnolence in a significant proportion of patients, which could complicate assessment of neurological symptoms in this patient.[5][10]  - NSAIDs and acetaminophen : These have limited efficacy as monotherapy and slower onset of action.[10][12]  Conclusion:  This patient has absolute medical contraindications to standard first-line vasoconstrictive migraine therapies (triptans and ergots) due  to her documented cerebrovascular disease with growing carotid aneurysm. Requiring step therapy with contraindicated medications would be medically inappropriate and potentially harmful. Zavzpret  (zavegepant) nasal spray is the only FDA-approved intranasal non-vasoconstrictive migraine-specific therapy and offers rapid onset of action critical for this patient's clinical presentation.  I respectfully request approval of Zavzpret  10 mg nasal spray without requiring step therapy, as the standard step therapy protocol is contraindicated in this patient. Please contact me if you require any additional clinical information.  References Safety, Tolerability, and Efficacy of Zavegepant 10 Mg Nasal Spray for the Acute Treatment of Migraine in the [place] : A Phase 3, Double-Blind, Randomised, Placebo-Controlled Multicentre Trial. Lipton RB, Croop R, Stock DA, et al. [employer]. Neurology. 2023;22(3):209-217. doi:10.1016/S1474-4422(22)00517-8. Management of Headache (2023). Slater Saucier PhD DNP MSN/Ed PMHCNS PMHNP-BC RN, Mylinda HERO.  Antonovich PharmD BCPS, Prentice BROCKS. Buelt DO, et al. Department of [facility]. Practice Guideline Update Summary: Acute Treatment of Migraine in Children and Adolescents: Report of the Guideline Development, Dissemination, and Implementation Subcommittee of the American Academy of Neurology and the American Headache Society. Maylene HERO Barbar ONEIDA, Holler-Managan Y, et al. Neurology. 2019;93(11):487-499. doi:10.1212/WNL.9999999999991904. Migraine. Dodick DW. Lancet [place], [place]). 2018;391(10127):1315-1330. doi:10.1016/S0140-6736(18)30478-1. Diagnosis and Management of Headache: A Review. Robbins MS. JAMA. 2021;325(18):1874-1885. doi:10.1001/jama.7978.8359. Migraine in Older Adults. Hugger SS, Do TP, Ashina H, et al. The Lancet. Neurology. 2023;22(10):934-945. doi:10.1016/S1474-4422(23)00206-5. Headache, Cerebral Aneurysms, and the Use of Triptans and Ergot Derivatives. Minnie EP. Headache. 2015;55(5):739-47. doi:10.1111/head.87437. The Risks or Lack Thereof of Migraine Treatments in Vascular Disease. Diener HC. Headache. 2020;60(3):649-653. doi:10.1111/head.L129006. Cardiovascular Disease and Migraine: Are the New Treatments Safe?. Robblee J, Harvey LK. Current Pain and Headache Reports. 2022;26(8):647-655. doi:10.1007/s11916-022-01064-4. Acute Treatments for Episodic Migraine in Adults: A Systematic Review and Meta-analysis. [facility], [place] Dennise BLONDER, Lucina HERO, et al. JAMA. 2021;325(23):2357-2369. doi:10.1001/jama.7978.2060. The American Headache Society Consensus Statement: Update on Integrating New Migraine Treatments Into Clinical Practice. Ailani J, Burch RC, Robbins MS. Headache. 2021;61(7):1021-1039. doi:10.1111/head.14153. Pharmacologic Treatments of Acute Episodic Migraine Headache in Outpatient Settings: A Clinical Guideline From the [employer]. Qaseem A, Rebecka SHILLING, Etxeandia-Ikobaltzeta I, et al. Annals of Internal Medicine. 2025;178(4):571-578.  doi:10.7326/ANNALS-24-03095. ZAVZPRET . Food and Drug Administration. Updated date: 2024-03-19. Carotid artery aneurysm A 3.3 mm carotid artery aneurysm was incidentally found during an MRI for complicated migraine. With a family history of aneurysms, monitoring is advised due to its small size and low rupture risk. Surgery is deferred until anemia is managed, as it increases surgical risk. If the aneurysm grows to 5 mm, stent placement may be considered. The neurosurgeon's plan is deemed safe and effective. Continue monitoring with annual angiograms. She is referred to a geneticist for family history evaluation and prescribed Plavix  for 3 months before potential surgery. Avoid Imitrex due to the aneurysm and use a nasal spray for migraine management. Tooth missing Three cavities are identified, emphasizing the importance of dental health to prevent infection during potential surgery. Dental treatment is advised prior to surgery, ensuring procedures are performed without epinephrine due to blood pressure concerns. Degeneration of intervertebral disc of lumbar region, unspecified whether pain present Degeneration with occasional sciatica and leg pain is noted, requiring no acute intervention. Iron deficiency anemia, unspecified iron deficiency anemia type Iron deficiency anemia   Severe iron deficiency anemia, likely due to menorrhagia, presents with dizziness and fatigue. Previous iron supplementation with calcium was ineffective. Acrypha is prescribed as a more effective oral iron supplement, with iron infusions considered if oral supplementation fails. Anemia increases surgery risk by 30% but is not  a contraindication. A comprehensive blood panel is ordered to assess iron levels and other deficiencies, and she is referred to Dempsey Ned for potential iron infusions. Chronic fatigue Fatigue is likely multifactorial, related to iron deficiency anemia, vitamin D deficiency, and possible hormonal  imbalance. A comprehensive blood panel is ordered to assess underlying causes. Vitamin D deficiency Previous levels were 25-26 ng/mL, possibly contributing to fatigue and weakness. A vitamin D level is ordered as part of a comprehensive blood panel. Nonrheumatic mitral valve regurgitation Mild mitral valve regurgitation is noted without current symptoms or need for intervention. Aneurysm surgery will not affect the mitral valve. Acne, unspecified acne type Worsening acne may be related to hormonal imbalance. Previous birth control use caused vomiting. She is referred to Darice Forest for management. Hormone imbalance Suspected hormonal imbalance may contribute to acne and other symptoms. A hormone panel is ordered for assessment. Menorrhagia with regular cycle Menorrhagia   Heavy menstrual bleeding contributes to iron deficiency anemia, with periods lasting 5 days and significant clotting. Options for period suppression, including hormonal treatments, were discussed, but she prefers iron supplementation. Acrypha is prescribed for iron supplementation, with hormonal treatment considered if ineffective. FH: aneurysm Referral made genetics    ORDER ASSOCIATIONS  #   DIAGNOSIS / CONDITION ICD-10 ENCOUNTER ORDER     ICD-10-CM   1. Complicated migraine  G43.109 Comprehensive metabolic panel with GFR    CBC with Differential/Platelet    Zavegepant HCl (ZAVZPRET ) 10 MG/ACT SOLN    2. Carotid artery aneurysm  I72.0 Lipid panel    Comprehensive metabolic panel with GFR    CBC with Differential/Platelet    3. Wernicke's aphasia  F80.2     4. Tooth missing  K08.409     5. Achilles tendinosis of left ankle  M67.874     6. History of TIA (transient ischemic attack)  Z86.73 CANCELED: TSH + free T4    7. Degeneration of intervertebral disc of lumbar region, unspecified whether pain present  M51.369     8. Iron deficiency anemia, unspecified iron deficiency anemia type  D50.9 Ferric Maltol   (ACCRUFER ) 30 MG CAPS    Iron, TIBC and Ferritin Panel    Pathologist smear review    Reticulocytes    Gliadin antibodies, serum    Tissue transglutaminase, IgA    C-reactive protein    Sedimentation rate    Reticulocytes    CANCELED: TSH Rfx on Abnormal to Free T4    CANCELED: Sedimentation rate    9. Chronic fatigue  R53.82 Ferric Maltol  (ACCRUFER ) 30 MG CAPS    Iron, TIBC and Ferritin Panel    Pathologist smear review    Reticulocytes    Reticulocytes    CANCELED: TSH Rfx on Abnormal to Free T4    CANCELED: TSH + free T4    10. Vitamin D deficiency  E55.9 Iron, TIBC and Ferritin Panel    Pathologist smear review    Reticulocytes    Reticulocytes    CANCELED: TSH Rfx on Abnormal to Free T4    11. Nonrheumatic mitral valve regurgitation  I34.0 CANCELED: TSH + free T4    12. Acne, unspecified acne type  L70.9 Ambulatory referral to Dermatology    adapalene  (DIFFERIN ) 0.1 % cream    13. Hormone imbalance  E34.9 TSH+Prl+FSH+TestT+LH+DHEA S...    CANCELED: TSH + free T4    14. Menorrhagia with regular cycle  N92.0 Von Willebrand panel    CANCELED: Von Willebrand Antigen    15. FH: aneurysm  Z82.49 Ambulatory referral to Genetics      Diagnoses and all orders for this visit: Complicated migraine -     Comprehensive metabolic panel with GFR -     CBC with Differential/Platelet -     Zavegepant HCl (ZAVZPRET ) 10 MG/ACT SOLN; Place 1 spray into the nose daily as needed. Take at first symptoms of complicated migraine Carotid artery aneurysm -     Lipid panel -     Comprehensive metabolic panel with GFR -     CBC with Differential/Platelet Wernicke's aphasia Tooth missing Achilles tendinosis of left ankle History of TIA (transient ischemic attack) Degeneration of intervertebral disc of lumbar region, unspecified whether pain present Iron deficiency anemia, unspecified iron deficiency anemia type -     Ferric Maltol  (ACCRUFER ) 30 MG CAPS; Take 1 capsule (30 mg total) by  mouth 2 (two) times daily. Recheck iron to decide how long to continue -     Iron, TIBC and Ferritin Panel -     Pathologist smear review -     Reticulocytes; Future -     Gliadin antibodies, serum -     Tissue transglutaminase, IgA -     C-reactive protein -     Sedimentation rate Chronic fatigue -     Ferric Maltol  (ACCRUFER ) 30 MG CAPS; Take 1 capsule (30 mg total) by mouth 2 (two) times daily. Recheck iron to decide how long to continue -     Iron, TIBC and Ferritin Panel -     Pathologist smear review -     Reticulocytes; Future Vitamin D deficiency -     Iron, TIBC and Ferritin Panel -     Pathologist smear review -     Reticulocytes; Future Nonrheumatic mitral valve regurgitation Acne, unspecified acne type -     Ambulatory referral to Dermatology -     adapalene  (DIFFERIN ) 0.1 % cream; Apply topically at bedtime. Hormone imbalance -     TSH+Prl+FSH+TestT+LH+DHEA S... Menorrhagia with regular cycle -     Von Willebrand panel FH: aneurysm -     Ambulatory referral to Genetics   Recommended follow up: Return in about 1 month (around 09/24/2024) for annual preventive care visit. Future Appointments  Date Time Provider Department Center  09/28/2024 10:20 AM Rosslyn Dino HERO, MD [facility] None  10/05/2024  2:00 PM Jesus Bernardino MATSU, MD [facility]             Additional notes: This document was synthesized by artificial intelligence (Abridge) using HIPAA-compliant recording of the clinical interaction;   We discussed the use of AI scribe software for clinical note transcription with the patient, who gave verbal consent to proceed.    Additional Info: This encounter employed state-of-the-art, real-time, collaborative documentation. The patient actively reviewed and assisted in updating their electronic medical record on a shared screen, ensuring transparency and facilitating joint problem-solving for the problem list, overview, and plan. This approach promotes  accurate, informed care. The treatment plan was discussed and reviewed in detail, including medication safety, potential side effects, and all patient questions. We confirmed understanding and comfort with the plan. Follow-up instructions were established, including contacting the office for any concerns, returning if symptoms worsen, persist, or new symptoms develop, and precautions for potential emergency department visits.  Initial Appointment Goals:  This initial visit focused on establishing a foundation for the patient's care. We collaboratively reviewed her medical history and medications in detail, updating the chart as shown in the encounter. Given the  extensive information, we prioritized addressing her most pressing concerns, which she reported were: New pt  (Pt is present to est care with pcp has iron problems would like to have check today ) and Fatigue (Makes here really tired iron levels always low )  While the complexity of the patient's medical picture may necessitate further evaluation in subsequent visits, we were able to develop a preliminary care plan together. To expedite a comprehensive plan at the next visit, we encouraged the patient to gather relevant medical records from previous providers. This collaborative approach will ensure a more complete understanding of the patient's health and inform the development of a personalized care plan. We look forward to continuing the conversation and working together with the patient on achieving her health goals.   Collaborative Documentation:  Today's encounter utilized real-time, dynamic patient engagement.  Patients actively participate by directly reviewing and assisting in updating their medical records through a shared screen. This transparency empowers patients to visually confirm chart updates made by the healthcare provider.  This collaborative approach facilitates problem management as we jointly update the problem list, problem  overview, and assessment/plan. Ultimately, this process enhances chart accuracy and completeness, fostering shared decision-making, patient education, and informed consent for tests and treatments.  Collaborative Treatment Planning:  Treatment plans were discussed and reviewed in detail.  Explained medication safety and potential side effects.  Encouraged participation and answered all patient questions, confirming understanding and comfort with the plan. Encouraged patient to contact our office if they have any questions or concerns. Agreed on patient returning to office if symptoms worsen, persist, or new symptoms develop.  ----------------------------------------------------- Bernardino KANDICE Cone, MD  08/25/2024 5:57 PM  [facility]:  [phone]     [1]  Current Meds  Medication Sig   adapalene  (DIFFERIN ) 0.1 % cream Apply topically at bedtime.   Ferric Maltol  (ACCRUFER ) 30 MG CAPS Take 1 capsule (30 mg total) by mouth 2 (two) times daily. Recheck iron to decide how long to continue   Zavegepant HCl (ZAVZPRET ) 10 MG/ACT SOLN Place 1 spray into the nose daily as needed. Take at first symptoms of complicated migraine   "

## 2024-08-25 ENCOUNTER — Ambulatory Visit: Payer: Self-pay | Admitting: Internal Medicine

## 2024-08-25 ENCOUNTER — Encounter: Payer: Self-pay | Admitting: Internal Medicine

## 2024-08-25 NOTE — Assessment & Plan Note (Signed)
 Referral made genetics

## 2024-08-25 NOTE — Assessment & Plan Note (Signed)
 A 3.3 mm carotid artery aneurysm was incidentally found during an MRI for complicated migraine. With a family history of aneurysms, monitoring is advised due to its small size and low rupture risk. Surgery is deferred until anemia is managed, as it increases surgical risk. If the aneurysm grows to 5 mm, stent placement may be considered. The neurosurgeon's plan is deemed safe and effective. Continue monitoring with annual angiograms. She is referred to a geneticist for family history evaluation and prescribed Plavix  for 3 months before potential surgery. Avoid Imitrex due to the aneurysm and use a nasal spray for migraine management.

## 2024-08-25 NOTE — Assessment & Plan Note (Signed)
 Suspected hormonal imbalance may contribute to acne and other symptoms. A hormone panel is ordered for assessment.

## 2024-08-25 NOTE — Assessment & Plan Note (Signed)
 Three cavities are identified, emphasizing the importance of dental health to prevent infection during potential surgery. Dental treatment is advised prior to surgery, ensuring procedures are performed without epinephrine due to blood pressure concerns.

## 2024-08-25 NOTE — Assessment & Plan Note (Signed)
 She experiences migraines with stroke-like symptoms, including right hand numbness and speech difficulties, which resolve spontaneously. The MRI revealed a carotid artery aneurysm, but symptoms are attributed to the migraine. Avoid Imitrex due to the aneurysm and use a nasal spray for acute migraine management. Prior Authorization Letter for Zavzpret  (Zavegepant) Nasal Spray RE: Prior Authorization Request for Zavzpret  (Zavegepant) 10 mg Nasal Spray   Date: August 24, 2024  To Whom It May Concern:  I am writing to request prior authorization for Zavzpret  (zavegepant) 10 mg nasal spray for the acute treatment of migraine in the above-referenced patient. I am requesting an exception to standard step therapy requirements based on absolute medical contraindications to first-line vasoconstrictive migraine therapies.  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 She has a documented growing carotid artery aneurysm and a history of brain aneurysm. Her migraines are associated with significant functional impairment and require rapid, effective acute treatment. The diagnostic challenge of distinguishing her migraine aura from true cerebrovascular events necessitates particularly careful medication selection.  Medical Necessity for Zavzpret :  Zavegepant is a calcitonin gene-related peptide (CGRP) receptor antagonist that is non-vasoconstrictive and represents the only FDA-approved intranasal gepant for acute migraine treatment.[1] This patient has absolute contraindications to standard first-line vasoconstrictive therapies:  1. Triptans are contraindicated in patients with cerebrovascular disease, including those with cerebral aneurysms. The FDA explicitly lists stroke, TIA, and cerebrovascular disease as contraindications to triptan use. Clinical practice guidelines from the American Academy of Neurology, American Headache  Society, and Department of [facility] all state that triptans should not be prescribed to patients with known or suspected cerebrovascular disease because of their vasoconstrictive effects and potential to increase the risk of cerebrovascular events.[2][3][4][5]  2. Ergot derivatives (ergotamine, dihydroergotamine) are similarly contraindicated due to their potent vasoconstrictive properties and are considered even more problematic than triptans in patients with vascular disease.[6][4]  3. NSAIDs have limited efficacy as monotherapy and do not address the need for rapid onset of action in this patient whose attacks mimic TIAs and require prompt differentiation from true cerebrovascular events.  Why Step Therapy is Inappropriate:  Standard step therapy protocols typically require trial and failure of triptans before authorizing gepants. However, requiring this patient to trial triptans would be medically inappropriate and potentially dangerous given her documented cerebrovascular disease. The FDA Adverse Event Reporting System has revealed unexpected associations between triptan use and ischemic cerebrovascular events, aneurysms, and artery dissections.[4] While some authors suggest the concern may be excessive, clinical guidelines maintain these contraindications based on the known pharmacology and vascular effects of triptans.[7][8]  Gepants, by their mechanism of action, are non-vasoconstrictive and do not carry the cardiovascular or cerebrovascular contraindications associated with triptans.[6][5][9][10] The [employer], American Headache Society, and other professional societies explicitly recommend gepants as appropriate alternatives when triptans are contraindicated or not tolerated.[10][11][12]  Why Zavzpret  Specifically:  Zavegepant nasal spray offers unique advantages for this patient:  1. Rapid onset of action: Phase 3 clinical trials demonstrate pain relief  beginning at 15 minutes post-dose, with sustained efficacy up to 48 hours. This rapid onset is critical for a patient whose attacks mimic TIAs and require prompt symptom resolution to facilitate clinical assessment.[1]  2. Non-oral formulation: The intranasal route provides early peak plasma concentrations and avoids first-pass metabolism, making it particularly useful for patients with rapidly escalating headache pain or nausea. Clinical guidelines specifically recommend non-oral therapies for attacks with severe nausea, vomiting, or rapidly escalating pain.[1][13][11]  3.  Proven efficacy: In the pivotal phase 3 trial ([id number]), zavegepant 10 mg nasal spray demonstrated statistically significant superiority over placebo for both coprimary endpoints: pain freedom at 2 hours (24% vs 15%, p0.0001) and freedom from most bothersome symptom at 2 hours (40% vs 31%, p=0.0012).[1][13]  4. Favorable safety profile: The most common adverse events were dysgeusia (21%), nasal discomfort (4%), and nausea (3%), all mild and self-limited. Importantly, there was no signal of hepatotoxicity or cardiovascular complications.[1][13]  5. No cardiovascular contraindications: Unlike triptans and ergots, zavegepant has no contraindications in patients with cerebrovascular disease and has been studied safely in populations with vascular comorbidities.[9][10]  Alternative Treatments Considered:  - Oral gepants (ubrogepant, rimegepant): While these are non-vasoconstrictive alternatives, they have slower onset of action compared to the intranasal formulation and may be less effective for rapidly escalating attacks.[1]  - Lasmiditan: This 5-HT1F receptor agonist is non-vasoconstrictive but carries an 8-hour driving restriction and causes dizziness and somnolence in a significant proportion of patients, which could complicate assessment of neurological symptoms in this patient.[5][10]  - NSAIDs and acetaminophen : These have  limited efficacy as monotherapy and slower onset of action.[10][12]  Conclusion:  This patient has absolute medical contraindications to standard first-line vasoconstrictive migraine therapies (triptans and ergots) due to her documented cerebrovascular disease with growing carotid aneurysm. Requiring step therapy with contraindicated medications would be medically inappropriate and potentially harmful. Zavzpret  (zavegepant) nasal spray is the only FDA-approved intranasal non-vasoconstrictive migraine-specific therapy and offers rapid onset of action critical for this patient's clinical presentation.  I respectfully request approval of Zavzpret  10 mg nasal spray without requiring step therapy, as the standard step therapy protocol is contraindicated in this patient. Please contact me if you require any additional clinical information.  References Safety, Tolerability, and Efficacy of Zavegepant 10 Mg Nasal Spray for the Acute Treatment of Migraine in the [place] : A Phase 3, Double-Blind, Randomised, Placebo-Controlled Multicentre Trial. Lipton RB, Croop R, Stock DA, et al. [employer]. Neurology. 2023;22(3):209-217. doi:10.1016/S1474-4422(22)00517-8. Management of Headache (2023). Slater Saucier PhD DNP MSN/Ed PMHCNS PMHNP-BC RN, Mylinda HERO. Antonovich PharmD BCPS, Prentice BROCKS. Buelt DO, et al. Department of [facility]. Practice Guideline Update Summary: Acute Treatment of Migraine in Children and Adolescents: Report of the Guideline Development, Dissemination, and Implementation Subcommittee of the American Academy of Neurology and the American Headache Society. Maylene HERO Barbar ONEIDA, Holler-Managan Y, et al. Neurology. 2019;93(11):487-499. doi:10.1212/WNL.9999999999991904. Migraine. Dodick DW. Lancet [place], [place]). 2018;391(10127):1315-1330. doi:10.1016/S0140-6736(18)30478-1. Diagnosis and Management of Headache: A Review. Robbins MS. JAMA. 2021;325(18):1874-1885. doi:10.1001/jama.7978.8359. Migraine  in Older Adults. Hugger SS, Do TP, Ashina H, et al. The Lancet. Neurology. 2023;22(10):934-945. doi:10.1016/S1474-4422(23)00206-5. Headache, Cerebral Aneurysms, and the Use of Triptans and Ergot Derivatives. Minnie EP. Headache. 2015;55(5):739-47. doi:10.1111/head.87437. The Risks or Lack Thereof of Migraine Treatments in Vascular Disease. Diener HC. Headache. 2020;60(3):649-653. doi:10.1111/head.B5579919. Cardiovascular Disease and Migraine: Are the New Treatments Safe?. Robblee J, Harvey LK. Current Pain and Headache Reports. 2022;26(8):647-655. doi:10.1007/s11916-022-01064-4. Acute Treatments for Episodic Migraine in Adults: A Systematic Review and Meta-analysis. [facility], Halker Dennise BLONDER, Lucina HERO, et al. JAMA. 2021;325(23):2357-2369. doi:10.1001/jama.7978.2060. The American Headache Society Consensus Statement: Update on Integrating New Migraine Treatments Into Clinical Practice. Ailani J, Burch RC, Robbins MS. Headache. 2021;61(7):1021-1039. doi:10.1111/head.14153. Pharmacologic Treatments of Acute Episodic Migraine Headache in Outpatient Settings: A Clinical Guideline From the [employer]. Qaseem A, Rebecka SHILLING, Etxeandia-Ikobaltzeta I, et al. Annals of Internal Medicine. 2025;178(4):571-578. doi:10.7326/ANNALS-24-03095. ZAVZPRET . Food and Drug Administration. Updated date: 2024-03-19.

## 2024-08-25 NOTE — Assessment & Plan Note (Signed)
 Previous levels were 25-26 ng/mL, possibly contributing to fatigue and weakness. A vitamin D level is ordered as part of a comprehensive blood panel.

## 2024-08-25 NOTE — Assessment & Plan Note (Signed)
 Degeneration with occasional sciatica and leg pain is noted, requiring no acute intervention.

## 2024-08-25 NOTE — Assessment & Plan Note (Signed)
 Iron deficiency anemia   Severe iron deficiency anemia, likely due to menorrhagia, presents with dizziness and fatigue. Previous iron supplementation with calcium was ineffective. Acrypha is prescribed as a more effective oral iron supplement, with iron infusions considered if oral supplementation fails. Anemia increases surgery risk by 30% but is not a contraindication. A comprehensive blood panel is ordered to assess iron levels and other deficiencies, and she is referred to Dempsey Ned for potential iron infusions.

## 2024-08-25 NOTE — Assessment & Plan Note (Signed)
 Worsening acne may be related to hormonal imbalance. Previous birth control use caused vomiting. She is referred to Darice Forest for management.

## 2024-08-25 NOTE — Assessment & Plan Note (Signed)
 Mild mitral valve regurgitation is noted without current symptoms or need for intervention. Aneurysm surgery will not affect the mitral valve.

## 2024-08-25 NOTE — Assessment & Plan Note (Signed)
 Menorrhagia   Heavy menstrual bleeding contributes to iron deficiency anemia, with periods lasting 5 days and significant clotting. Options for period suppression, including hormonal treatments, were discussed, but she prefers iron supplementation. Acrypha is prescribed for iron supplementation, with hormonal treatment considered if ineffective.

## 2024-08-25 NOTE — Assessment & Plan Note (Signed)
 Fatigue is likely multifactorial, related to iron deficiency anemia, vitamin D deficiency, and possible hormonal imbalance. A comprehensive blood panel is ordered to assess underlying causes.

## 2024-08-28 LAB — IRON,TIBC AND FERRITIN PANEL
%SAT: 5 % — ABNORMAL LOW (ref 16–45)
Ferritin: 4 ng/mL — ABNORMAL LOW (ref 16–154)
Iron: 22 ug/dL — ABNORMAL LOW (ref 40–190)
TIBC: 451 ug/dL — ABNORMAL HIGH (ref 250–450)

## 2024-08-28 LAB — TSH+PRL+FSH+TESTT+LH+DHEA S...
17-Hydroxyprogesterone: 139 ng/dL
Androstenedione: 53 ng/dL (ref 41–262)
DHEA-SO4: 138 ug/dL (ref 84.8–378.0)
FSH: 2.9 m[IU]/mL
LH: 7.5 m[IU]/mL
Prolactin: 14.4 ng/mL (ref 4.8–33.4)
TSH: 1.32 u[IU]/mL (ref 0.450–4.500)
Testosterone, Free: <0.2 pg/mL (ref 0.0–4.2)
Testosterone: 13 ng/dL (ref 8–60)

## 2024-08-28 LAB — VON WILLEBRAND PANEL
Factor-VIII Activity: 115 %{normal} (ref 50–180)
Ristocetin Co-Factor: 75 %{normal} (ref 42–200)
Von Willebrand Antigen, Plasma: 86 % (ref 50–217)
aPTT: 27 s (ref 23–32)

## 2024-08-28 LAB — SEDIMENTATION RATE: Sed Rate: 28 mm/h — ABNORMAL HIGH (ref 0–20)

## 2024-08-28 LAB — GLIADIN ANTIBODIES, SERUM
Deamidated Gliadin Abs, IgG: <1 U/mL
Gliadin IgA: <1 U/mL

## 2024-08-28 LAB — TISSUE TRANSGLUTAMINASE, IGA: (tTG) Ab, IgA: <1 U/mL

## 2024-08-30 ENCOUNTER — Telehealth: Payer: Self-pay

## 2024-08-30 ENCOUNTER — Other Ambulatory Visit (HOSPITAL_COMMUNITY): Payer: Self-pay

## 2024-08-30 DIAGNOSIS — L709 Acne, unspecified: Secondary | ICD-10-CM

## 2024-08-30 DIAGNOSIS — G43109 Migraine with aura, not intractable, without status migrainosus: Secondary | ICD-10-CM

## 2024-08-30 NOTE — Telephone Encounter (Signed)
 Pharmacy Patient Advocate Encounter   Received notification from [employer] [facility] KEY that prior authorization for Zavzpret  10MG /ACT Solution is required/requested.   Insurance verification completed.   The patient is insured through [facility] MEDICAID.   Per test claim:  Nurtec ODT, or Holland is preferred by the insurance.  If suggested medication is appropriate, Please send in a new RX and discontinue this one. If not, please advise as to why it's not appropriate so that we may request a Prior Authorization. Please note, some preferred medications may still require a PA.  If the suggested medications have not been trialed and there are no contraindications to their use, the PA will not be submitted, as it will not be approved. Archived Key: [id number]

## 2024-08-30 NOTE — Telephone Encounter (Signed)
 Pharmacy Patient Advocate Encounter   Received notification from [facility] KEY that prior authorization for Adapalene  0.1% cream is required/requested.   Insurance verification completed.   The patient is insured through [facility] MEDICAID.   Per test claim:  ADAPALENE  GEL FORM  is preferred by the insurance.  If suggested medication is appropriate, Please send in a new RX and discontinue this one. If not, please advise as to why it's not appropriate so that we may request a Prior Authorization. Please note, some preferred medications may still require a PA.  If the suggested medications have not been trialed and there are no contraindications to their use, the PA will not be submitted, as it will not be approved. Archived Key: [id number]

## 2024-08-31 ENCOUNTER — Other Ambulatory Visit (HOSPITAL_COMMUNITY): Payer: Self-pay

## 2024-08-31 ENCOUNTER — Telehealth: Payer: Self-pay

## 2024-08-31 MED ORDER — ADAPALENE-BENZOYL PEROXIDE 0.1-2.5 % EX GEL: 1.0000 | Freq: Every day | CUTANEOUS | 11 refills | Status: AC

## 2024-08-31 MED ORDER — NURTEC 75 MG PO TBDP: 1.0000 | ORAL_TABLET | Freq: Every day | ORAL | 3 refills | Status: AC | PRN

## 2024-08-31 NOTE — Telephone Encounter (Signed)
 Pharmacy Patient Advocate Encounter   Received notification from [employer] that prior authorization for Nurtec 75MG  dispersible tablets is required/requested.   Insurance verification completed.   The patient is insured through [facility] MEDICAID.   Per test claim: The current 9 day co-pay is, $4.00.  No PA needed at this time. This test claim was processed through [facility]- copay amounts may vary at other pharmacies due to pharmacy/plan contracts, or as the patient moves through the different stages of their insurance plan.

## 2024-09-28 ENCOUNTER — Ambulatory Visit: Admitting: Neurosurgery

## 2024-10-05 ENCOUNTER — Encounter: Admitting: Internal Medicine

## 2024-10-05 ENCOUNTER — Ambulatory Visit: Admitting: Neurosurgery

## 2024-10-12 ENCOUNTER — Ambulatory Visit: Admitting: Neurosurgery

## 2024-10-19 ENCOUNTER — Inpatient Hospital Stay
